# Patient Record
Sex: Male | Born: 1995 | Race: Black or African American | Hispanic: No | Marital: Single | Smoking: Never smoker
Health system: Southern US, Community
[De-identification: ages and names within clinical notes are randomized; demographics above are authoritative.]

---
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
## 2013-09-07 ENCOUNTER — Encounter (HOSPITAL_COMMUNITY): Payer: Self-pay | Admitting: Emergency Medicine

## 2013-09-07 ENCOUNTER — Emergency Department (HOSPITAL_COMMUNITY)
Admission: EM | Admit: 2013-09-07 | Discharge: 2013-09-07 | Disposition: A | Discharge status: Home / Self Care | Payer: BC Managed Care – PPO | Source: Home / Self Care

## 2013-09-07 DIAGNOSIS — J019 Acute sinusitis, unspecified: Secondary | ICD-10-CM

## 2013-09-07 MED ORDER — FEXOFENADINE HCL 180 MG PO TABS: 180.0000 mg | ORAL_TABLET | Freq: Every day | ORAL | Status: DC

## 2013-09-07 NOTE — ED Provider Notes (Signed)
CSN: [id number]     Arrival date & time 09/07/13  1038 History   First MD Initiated Contact with Patient 09/07/13 1247     Chief Complaint  Patient presents with  . Cough   (Consider location/radiation/quality/duration/timing/severity/associated sxs/prior Treatment) HPI Comments: 17 year old male is accompanied by his mother with a complaint of cough for 3 weeks. He has been taking Mucinex during this time but without relief. He denies PND or fever.  Patient is a 17 y.o. male presenting with cough.  Cough Associated symptoms: sore throat   Associated symptoms: no diaphoresis, no ear pain, no eye discharge, no fever, no rash, no rhinorrhea, no shortness of breath and no wheezing     History reviewed. No pertinent past medical history. History reviewed. No pertinent past surgical history. No family history on file. History  Substance Use Topics  . Smoking status: Never Smoker   . Smokeless tobacco: Not on file  . Alcohol Use: No    Review of Systems  Constitutional: Negative for fever, diaphoresis, activity change and fatigue.  HENT: Positive for sore throat. Negative for ear pain, facial swelling, postnasal drip, rhinorrhea and trouble swallowing.   Eyes: Negative for pain, discharge and redness.  Respiratory: Positive for cough. Negative for chest tightness, shortness of breath and wheezing.   Cardiovascular: Negative.   Gastrointestinal: Negative.   Musculoskeletal: Negative.  Negative for neck pain and neck stiffness.  Skin: Negative for rash.  Neurological: Negative.     Allergies  Review of patient's allergies indicates no known allergies.  Home Medications   Current Outpatient Rx  Name  Route  Sig  Dispense  Refill  . fexofenadine (ALLEGRA) 180 MG tablet   Oral   Take 1 tablet (180 mg total) by mouth daily.   14 tablet   0    BP 106/94  Pulse 72  Temp(Src) 97.1 F (36.2 C) (Oral)  Resp 16  Wt 185 lb (83.915 kg)  SpO2 99% Physical Exam  Nursing note  and vitals reviewed. Constitutional: He is oriented to person, place, and time. He appears well-developed and well-nourished. No distress.  HENT:  Mouth/Throat: No oropharyngeal exudate.  Bilateral TMs are normal Oropharynx with minor erythema and copious amount of clear PND.  Eyes: Conjunctivae and EOM are normal.  Neck: Normal range of motion. Neck supple.  Cardiovascular: Normal rate, regular rhythm and normal heart sounds.   Pulmonary/Chest: Effort normal and breath sounds normal. No respiratory distress. He has no wheezes. He has no rales.  Musculoskeletal: Normal range of motion. He exhibits no edema.  Lymphadenopathy:    He has no cervical adenopathy.  Neurological: He is alert and oriented to person, place, and time.  Skin: Skin is warm and dry. No rash noted.  Psychiatric: He has a normal mood and affect.    ED Course  Procedures (including critical care time) Labs Review Labs Reviewed - No data to display Imaging Review No results found.     MDM   1. Acute rhinosinusitis    Allegra 180 mg daily when necessary drainage Robitussin-DM as directed Drink plenty of fluids stay well hydrated     Hayden Rasmussen, NP 09/07/13 1305

## 2013-09-07 NOTE — ED Notes (Signed)
17 yr old is here with his mom with complaints of cough-yellowish for three weeks. He has tried Mucinex and NyQuil with no success. Denies: SOB; Chest Pain

## 2013-09-11 NOTE — ED Provider Notes (Signed)
Medical screening examination/treatment/procedure(s) were performed by a resident physician or non-physician practitioner and as the supervising physician I was immediately available for consultation/collaboration.  Evan Corey, MD    Evan S Corey, MD 09/11/13 0844 

## 2013-12-13 DIAGNOSIS — M779 Enthesopathy, unspecified: Secondary | ICD-10-CM

## 2014-03-18 ENCOUNTER — Encounter (HOSPITAL_COMMUNITY): Payer: Self-pay | Admitting: Emergency Medicine

## 2014-03-18 ENCOUNTER — Emergency Department (INDEPENDENT_AMBULATORY_CARE_PROVIDER_SITE_OTHER)
Admission: EM | Admit: 2014-03-18 | Discharge: 2014-03-18 | Disposition: A | Discharge status: Home / Self Care | Payer: BC Managed Care – PPO | Source: Home / Self Care | Attending: Emergency Medicine | Admitting: Emergency Medicine

## 2014-03-18 DIAGNOSIS — L309 Dermatitis, unspecified: Secondary | ICD-10-CM

## 2014-03-18 DIAGNOSIS — L259 Unspecified contact dermatitis, unspecified cause: Secondary | ICD-10-CM

## 2014-03-18 MED ORDER — TRIAMCINOLONE ACETONIDE 0.1 % EX CREA: 1.0000 "application " | TOPICAL_CREAM | Freq: Three times a day (TID) | CUTANEOUS | Status: DC

## 2014-03-18 MED ORDER — PREDNISONE 20 MG PO TABS: 20.0000 mg | ORAL_TABLET | Freq: Two times a day (BID) | ORAL | Status: DC

## 2014-03-18 NOTE — Discharge Instructions (Signed)
Eczema has been described as "the itch that rashes."  The primary problem is inflammation of the skin, this is followed by the irresistible urge to scratch.  The scratching is what causes the rash.   ° °Eczema is thought to be hereditary.  It is often accompanied by other inflammatory diseases such as asthma or hay fever.  There are certain environmental things that may make it worse such as dryness of the skin, wool clothing, infection, certain allergens, and stress.  It is important to recognize that it is not caused by stress, and the search for an allergic cause is not helpful. ° °While there is no cure for eczema, it can be controlled with prescription medication and lifestyle changes. Suggestions follow for successful control of eczema: ° °· Avoid harsh soaps.  Use Dove, Cetaphil, Neutrogena, Aveeno.  Do not use Ivory. °· Take infrequent baths or showers, use luke warm water.  Get in and out of the bath or shower as quickly as possible.  Do not scrub the skin. Pat the skin dry after bathing.   °· Immediately after bathing apply a skin moisturizer such as Aquaphor, Vaseline, Eucerin, Cetaphil, Nutraderm, or Nivea. These should be applied 2 times a day, immediately after bathing and after all hand washing.  °· Use unscented laundry detergent such as Cheer-free, All-free, or unscented Bounce since these have no perfumes or preservatives.  °· Keep thermostat as low as possible in winter.  Consider a humidifier in bedroom.   °· Avoid wool clothing, blended or synthetic fabric or shirt collar tags--use  100% cotton clothing instead.  °· Avoid scratching.  Try using an ice cube on  Itchy area.  °· May use antihistamines such as Benadryl, Zyrtec, Allegra, or Claritin for itching.  °· Treat skin infections immediately. °· Eczema is a chronic and recurring condition.  You should be followed by a primary care doctor or a dermatology specialist or both.  ° ° ° °

## 2014-03-18 NOTE — ED Notes (Signed)
Patient complains of rash to the center of his chest For the past couple of days. On and off again becomes itchy

## 2014-03-18 NOTE — ED Provider Notes (Signed)
  Chief Complaint    Chief Complaint  Patient presents with  . Rash    History of Present Illness      Manuel Bolton is a 18 year old male who has had a 3 to four-day history of a rash on his right upper chest and right shoulder. He cannot think of anything that may have come in contact with his skin such as allergens or antigens. No exposure to poison ivy, changes in soaps, detergents, washing powders, fabric softeners, or dryer sheets. He's had no exposure to plants, animals, chemicals, cosmetics, or hair care products. He denies any history of allergies or asthma. He's had no fever, chills, swelling of lips, tongue, or throat, coughing, wheezing, or shortness of breath.  Review of Systems   Other than as noted above, the patient denies any of the following symptoms: Systemic:  No fever or chills. ENT:  No nasal congestion, rhinorrhea, sore throat, swelling of lips, tongue or throat. Resp:  No cough, wheezing, or shortness of breath.  PMFSH    Past medical history, family history, social history, meds, and allergies were reviewed.   Physical Exam     Vital signs:  BP 115/65  Pulse 49  Temp(Src) 99.1 F (37.3 C) (Oral)  Resp 18  SpO2 100% Gen:  Alert, oriented, in no distress. ENT:  Pharynx clear, no intraoral lesions, moist mucous membranes. Lungs:  Clear to auscultation. Skin:  He has a hyperpigmented, eczematous area on the right upper chest, right shoulder, and a small streaky area on his upper back just above his shoulder blade. Skin was otherwise clear.  Assessment    The encounter diagnosis was Eczema.  Differential diagnosis is eczema versus contact dermatitis.  Plan     1.  Meds:  The following meds were prescribed:   Discharge Medication List as of 03/18/2014  7:02 PM    START taking these medications   Details  predniSONE (DELTASONE) 20 MG tablet Take 1 tablet (20 mg total) by mouth 2 (two) times daily., Starting 03/18/2014, Until Discontinued, Normal     triamcinolone cream (KENALOG) 0.1 % Apply 1 application topically 3 (three) times daily., Starting 03/18/2014, Until Discontinued, Normal        2.  Patient Education/Counseling:  The patient was given appropriate handouts, self care instructions, and instructed in symptomatic relief.  If no better in one week, followup with dermatology.  3.  Follow up:  The patient was told to follow up here if no better in 3 to 4 days, or sooner if becoming worse in any way, and given some red flag symptoms such as worsening rash, fever, or difficulty breathing which would prompt immediate return.  Follow up here if necessary.      Reuben Likesavid C Harmoney Sienkiewicz, MD 03/18/14 [phone]

## 2014-05-20 ENCOUNTER — Emergency Department (INDEPENDENT_AMBULATORY_CARE_PROVIDER_SITE_OTHER)
Admission: EM | Admit: 2014-05-20 | Discharge: 2014-05-20 | Disposition: A | Discharge status: Home / Self Care | Payer: BC Managed Care – PPO | Source: Home / Self Care | Attending: Emergency Medicine | Admitting: Emergency Medicine

## 2014-05-20 ENCOUNTER — Encounter (HOSPITAL_COMMUNITY): Payer: Self-pay | Admitting: Emergency Medicine

## 2014-05-20 DIAGNOSIS — X500XXA Overexertion from strenuous movement or load, initial encounter: Secondary | ICD-10-CM

## 2014-05-20 DIAGNOSIS — X503XXA Overexertion from repetitive movements, initial encounter: Secondary | ICD-10-CM

## 2014-05-20 DIAGNOSIS — S335XXA Sprain of ligaments of lumbar spine, initial encounter: Secondary | ICD-10-CM

## 2014-05-20 DIAGNOSIS — S39012A Strain of muscle, fascia and tendon of lower back, initial encounter: Secondary | ICD-10-CM

## 2014-05-20 DIAGNOSIS — Y939 Activity, unspecified: Secondary | ICD-10-CM

## 2014-05-20 LAB — POCT URINALYSIS DIP (DEVICE)
BILIRUBIN URINE: NEGATIVE
Glucose, UA: NEGATIVE mg/dL
Hgb urine dipstick: NEGATIVE
Ketones, ur: NEGATIVE mg/dL
LEUKOCYTES UA: NEGATIVE
Nitrite: NEGATIVE
PH: 7.5 (ref 5.0–8.0)
PROTEIN: 30 mg/dL — AB
Specific Gravity, Urine: 1.02 (ref 1.005–1.030)
Urobilinogen, UA: 1 mg/dL (ref 0.0–1.0)

## 2014-05-20 MED ORDER — CYCLOBENZAPRINE HCL 5 MG PO TABS: 5.0000 mg | ORAL_TABLET | Freq: Three times a day (TID) | ORAL | Status: DC | PRN

## 2014-05-20 MED ORDER — NAPROXEN 500 MG PO TABS: 500.0000 mg | ORAL_TABLET | Freq: Two times a day (BID) | ORAL | Status: DC

## 2014-05-20 NOTE — ED Provider Notes (Signed)
Chief Complaint   Flank Pain   History of Present Illness   Manuel Bolton is a 18 year old male who works for [employer]. He does lots of heavy lifting. Over the past 5 days he's had pain in the left, upper lumbar spine radiating into the lower chest area. He denies any radiation down into the lower back or buttock. There is no radiation to the leg. He denies any fever or chills. The pain is not worse with breathing. He denies any coughing, wheezing, or shortness of breath. He denies any abdominal pain, nausea, vomiting, or urinary symptoms. There is no numbness or tingling in the legs, no weakness, no bladder or bowel dysfunction. The pain is worse with bending, twisting, lifting.  Review of Systems   Other than as noted above, the patient denies any of the following symptoms: Systemic:  No fever, chills, or unexplained weight loss. GI:  No abdominal pain or incontinence of bowel. GU:  No dysuria, frequency, urgency, or hematuria. No incontinence of urine or urinary retention.  M-S:  No neck pain or arthritis. Neuro:  No paresthesias, headache, saddle anesthesia, muscular weakness, or progressive neurological deficit.  PMFSH   Past medical history, family history, social history, meds, and allergies were reviewed. Specifically, there is no history of cancer, major trauma, osteoporosis, immunosuppression, or HIV infection.   Physical Examination    Vital signs:  BP 110/64  Pulse 54  Temp(Src) 98.9 F (37.2 C) (Oral)  Resp 16  SpO2 100% General:  Alert, oriented, in no distress. Lungs: Clear to auscultation. Heart: Regular rhythm, no gallop or murmur. Abdomen:  Soft, non-tender.  No organomegaly or mass.  No pulsatile midline abdominal mass or bruit. Back:  There is mild pain to palpation around the right CVA area. There is no CVA tenderness to percussion. The back has a full range of motion with minimal pain. Straight leg raising is negative. Neuro:  Normal muscle strength, sensations  and DTRs. Extremities: Pedal pulses were full, there was no edema. Skin:  Clear, warm and dry.  No rash.  Labs   Results for orders placed during the hospital encounter of 05/20/14  POCT URINALYSIS DIP (DEVICE)      Result Value Ref Range   Glucose, UA NEGATIVE  NEGATIVE mg/dL   Bilirubin Urine NEGATIVE  NEGATIVE   Ketones, ur NEGATIVE  NEGATIVE mg/dL   Specific Gravity, Urine 1.020  1.005 - 1.030   Hgb urine dipstick NEGATIVE  NEGATIVE   pH 7.5  5.0 - 8.0   Protein, ur 30 (*) NEGATIVE mg/dL   Urobilinogen, UA 1.0  0.0 - 1.0 mg/dL   Nitrite NEGATIVE  NEGATIVE   Leukocytes, UA NEGATIVE  NEGATIVE    Assessment   The encounter diagnosis was Lumbar strain, initial encounter.  No evidence of cauda equina syndrome, discitis, epidural abscess, or aneurism.    Plan     1.  Meds:  The following meds were prescribed:   Discharge Medication List as of 05/20/2014  5:50 PM    START taking these medications   Details  cyclobenzaprine (FLEXERIL) 5 MG tablet Take 1 tablet (5 mg total) by mouth 3 (three) times daily as needed for muscle spasms., Starting 05/20/2014, Until Discontinued, Normal    naproxen (NAPROSYN) 500 MG tablet Take 1 tablet (500 mg total) by mouth 2 (two) times daily., Starting 05/20/2014, Until Discontinued, Normal        2.  Patient Education/Counseling:  The patient was given appropriate handouts, self care instructions, and  instructed in symptomatic relief. The patient was encouraged to try to be as active as possible and given some exercises to do followed by moist heat.  3.  Follow up:  The patient was told to follow up here if no better in 3 to 4 days, or sooner if becoming worse in any way, and given some red flag symptoms such as worsening pain or new neurological symptoms which would prompt immediate return.      Reuben Likes, MD 05/20/14 [phone]

## 2014-05-20 NOTE — Discharge Instructions (Signed)
Do exercises twice daily followed by moist heat for 15 minutes. ° ° ° ° ° °Try to be as active as possible. ° °If no better in 2 weeks, follow up with orthopedist. ° ° °

## 2014-05-20 NOTE — ED Notes (Signed)
Right side pain for 5 days.  No known injury.  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  Has tried rest and advil .

## 2015-12-31 ENCOUNTER — Emergency Department (HOSPITAL_COMMUNITY)
Admission: EM | Admit: 2015-12-31 | Discharge: 2015-12-31 | Disposition: A | Discharge status: Home / Self Care | Payer: Worker's Compensation | Attending: Emergency Medicine | Admitting: Emergency Medicine

## 2015-12-31 ENCOUNTER — Encounter (HOSPITAL_COMMUNITY): Payer: Self-pay | Admitting: Emergency Medicine

## 2015-12-31 DIAGNOSIS — Z79899 Other long term (current) drug therapy: Secondary | ICD-10-CM | POA: Diagnosis not present

## 2015-12-31 DIAGNOSIS — S0181XA Laceration without foreign body of other part of head, initial encounter: Secondary | ICD-10-CM | POA: Diagnosis present

## 2015-12-31 DIAGNOSIS — Y9289 Other specified places as the place of occurrence of the external cause: Secondary | ICD-10-CM | POA: Insufficient documentation

## 2015-12-31 DIAGNOSIS — Y9389 Activity, other specified: Secondary | ICD-10-CM | POA: Insufficient documentation

## 2015-12-31 DIAGNOSIS — W228XXA Striking against or struck by other objects, initial encounter: Secondary | ICD-10-CM | POA: Insufficient documentation

## 2015-12-31 DIAGNOSIS — Y990 Civilian activity done for income or pay: Secondary | ICD-10-CM | POA: Diagnosis not present

## 2015-12-31 DIAGNOSIS — Z791 Long term (current) use of non-steroidal anti-inflammatories (NSAID): Secondary | ICD-10-CM | POA: Insufficient documentation

## 2015-12-31 DIAGNOSIS — Z7952 Long term (current) use of systemic steroids: Secondary | ICD-10-CM | POA: Diagnosis not present

## 2015-12-31 DIAGNOSIS — S0990XA Unspecified injury of head, initial encounter: Secondary | ICD-10-CM

## 2015-12-31 MED ORDER — LIDOCAINE-EPINEPHRINE 2 %-1:100000 IJ SOLN: 20.0000 mL | Freq: Once | INTRAMUSCULAR | Status: DC

## 2015-12-31 NOTE — Discharge Instructions (Signed)
Please read and follow all provided instructions.  Your diagnoses today include:  1. Facial laceration, initial encounter   2. Minor head injury, initial encounter     Tests performed today include:  Vital signs. See below for your results today.   Medications prescribed:   None  Take any prescribed medications only as directed.   Home care instructions:  Follow any educational materials and wound care instructions contained in this packet.   Keep affected area above the level of your heart when possible to minimize swelling. Wash area gently twice a day with warm soapy water. Do not apply alcohol or hydrogen peroxide. Cover the area if it draining or weeping.   Follow-up instructions: Suture Removal: Return to the Emergency Department or see your primary care care doctor in 4-5 days for a recheck of your wound and removal of your sutures or staples.    Return instructions:  Return to the Emergency Department if you have:  Fever  Worsening pain  Worsening swelling of the wound  Pus draining from the wound  Redness of the skin that moves away from the wound, especially if it streaks away from the affected area   Any other emergent concerns  Your vital signs today were: BP 144/75 mmHg   Pulse 72   Temp(Src) 98.5 F (36.9 C) (Oral)   Resp 18   Ht 5\' 11"  (1.803 m)   Wt 83.915 kg   BMI 25.81 kg/m2   SpO2 94% If your blood pressure (BP) was elevated above 135/85 this visit, please have this repeated by your doctor within one month. --------------

## 2015-12-31 NOTE — ED Notes (Signed)
Pt states he got hurt at work  Pt states he got hit in the head with a metal rail  Pt has a small laceration between his eyes  Bleeding controlled  Denies LOC

## 2015-12-31 NOTE — ED Provider Notes (Signed)
CSN: [id number]     Arrival date & time 12/31/15  2112 History  By signing my name below, I, [name], attest that this documentation has been prepared under the direction and in the presence of Renne Crigler, PA-C. Electronically Signed: [name], ED Scribe. 12/31/2015. 10:27 PM.    Chief Complaint  Patient presents with  . Facial Laceration    The history is provided by the patient. No language interpreter was used.   HPI Comments: Manuel Bolton is a 20 y.o. male who presents to the Emergency Department complaining of sudden onset, gradual worsening, moderate, laceration to the central forehead onset about 2 hours ago. Pt reports he was at work when he was hit in the head with a metal rail in between his eyes. He notes running cold water over the laceration to clean it out. He reports he did not lose consciousness. Pt is up to date on his tetanus shot. Denies vomiting or visual changes. Pt has no known drug allergies.   History reviewed. No pertinent past medical history. History reviewed. No pertinent past surgical history. Family History  Problem Relation Age of Onset  . Diabetes Other   . Stroke Other   . Cancer Other    Social History  Substance Use Topics  . Smoking status: Never Smoker   . Smokeless tobacco: None  . Alcohol Use: No    Review of Systems  Constitutional: Negative for fatigue.  HENT: Negative for tinnitus.   Eyes: Negative for photophobia, pain and visual disturbance.  Respiratory: Negative for shortness of breath.   Cardiovascular: Negative for chest pain.  Gastrointestinal: Negative for nausea and vomiting.  Musculoskeletal: Negative for back pain, gait problem and neck pain.  Skin: Positive for wound.  Neurological: Negative for dizziness, weakness, light-headedness, numbness and headaches.  Psychiatric/Behavioral: Negative for confusion and decreased concentration.      Allergies  Review of patient's allergies indicates no known  allergies.  Home Medications   Prior to Admission medications   Medication Sig Start Date End Date Taking? Authorizing Provider  cyclobenzaprine (FLEXERIL) 5 MG tablet Take 1 tablet (5 mg total) by mouth 3 (three) times daily as needed for muscle spasms. 05/20/14   Reuben Likes, MD  fexofenadine (ALLEGRA) 180 MG tablet Take 1 tablet (180 mg total) by mouth daily. 09/07/13   Hayden Rasmussen, NP  naproxen (NAPROSYN) 500 MG tablet Take 1 tablet (500 mg total) by mouth 2 (two) times daily. 05/20/14   Reuben Likes, MD  predniSONE (DELTASONE) 20 MG tablet Take 1 tablet (20 mg total) by mouth 2 (two) times daily. 03/18/14   Reuben Likes, MD  triamcinolone cream (KENALOG) 0.1 % Apply 1 application topically 3 (three) times daily. 03/18/14   Reuben Likes, MD   Triage vitals: BP 144/75 mmHg  Pulse 72  Temp(Src) 98.5 F (36.9 C) (Oral)  Resp 18  Ht  (1.803 m)  Wt 185 lb (83.915 kg)  BMI 25.81 kg/m2  SpO2 94%  Physical Exam  Constitutional: He is oriented to person, place, and time. He appears well-developed and well-nourished.  HENT:  Head: Normocephalic. Head is without raccoon's eyes and without Battle's sign.  Right Ear: Tympanic membrane, external ear and ear canal normal. No hemotympanum.  Left Ear: Tympanic membrane, external ear and ear canal normal. No hemotympanum.  Nose: Nose normal. No nasal septal hematoma.  Mouth/Throat: Oropharynx is clear and moist.  2cm linear vertical laceration, mildly gaping, clean, hemostatic, between eyes on inferior  forehead. Base of wound fully explored and no foreign bodies, tendon, nerve, or significant vascular injury noted. Patient able to raise forehead without difficulty.   Eyes: Conjunctivae, EOM and lids are normal. Pupils are equal, round, and reactive to light.  No visible hyphema  Neck: Normal range of motion. Neck supple.  Cardiovascular: Normal rate and regular rhythm.   Pulmonary/Chest: Effort normal and breath sounds normal.   Abdominal: Soft. There is no tenderness.  Musculoskeletal: Normal range of motion.       Cervical back: He exhibits normal range of motion, no tenderness and no bony tenderness.       Thoracic back: He exhibits no tenderness and no bony tenderness.       Lumbar back: He exhibits no tenderness and no bony tenderness.  Neurological: He is alert and oriented to person, place, and time. He has normal strength and normal reflexes. No cranial nerve deficit or sensory deficit. Coordination normal. GCS eye subscore is 4. GCS verbal subscore is 5. GCS motor subscore is 6.  Skin: Skin is warm and dry.  Psychiatric: He has a normal mood and affect.  Nursing note and vitals reviewed.   ED Course  Procedures  DIAGNOSTIC STUDIES: Oxygen Saturation is 94% on RA, low by my interpretation.  COORDINATION OF CARE:  10:25 PM Discussed treatment plan which includes numbing the area and suturing the laceration with pt at bedside and pt agreed to plan.  LACERATION REPAIR Performed by: Carolee RotaGEIPLE,Lakiesha Ralphs S Authorized by: Carolee RotaGEIPLE,Jameca Chumley S Consent: Verbal consent obtained. Risks and benefits: risks, benefits and alternatives were discussed Consent given by: patient Patient identity confirmed: provided demographic data Prepped and Draped in normal sterile fashion Wound explored  Laceration Location: forehead  Laceration Length: 2cm  No Foreign Bodies seen or palpated  Anesthesia: local infiltration  Local anesthetic: lidocaine 2% without epinephrine  Anesthetic total: 3 ml  Irrigation method: skin scrub with dermal cleanser Amount of cleaning: standard  Skin closure: 5-0 Ethilon  Number of sutures: 7  Technique: simple interrupted  Patient tolerance: Patient tolerated the procedure well with no immediate complications.   11:19 PM Patient counseled on wound care. Patient counseled on need to return or see PCP/urgent care for suture removal in 4-5 days. Patient was urged to return to the  Emergency Department urgently with worsening pain, swelling, expanding erythema especially if it streaks away from the affected area, fever, or if they have any other concerns. Patient verbalized understanding.   Patient was counseled on head injury precautions and symptoms that should indicate their return to the ED.  These include severe worsening headache, vision changes, confusion, loss of consciousness, trouble walking, nausea & vomiting, or weakness/tingling in extremities.      MDM   Final diagnoses:  Facial laceration, initial encounter  Minor head injury, initial encounter   Patient with facial laceration and minor head injury. Facial laceration repaired as above. No significant muscular, vascular, tendon involvement noted.  Minor head injury: No indication for head CT at this time per [place] head CT rules. Patient does not have any other sequelae from his injury at this time.  I personally performed the services described in this documentation, which was scribed in my presence. The recorded information has been reviewed and is accurate.    Renne CriglerJoshua Maryan Sivak, PA-C 12/31/15 2321  Lyndal Pulleyaniel Knott, MD 01/01/16 [phone]

## 2016-04-08 ENCOUNTER — Ambulatory Visit (HOSPITAL_COMMUNITY)
Admission: EM | Admit: 2016-04-08 | Discharge: 2016-04-08 | Disposition: A | Discharge status: Home / Self Care | Payer: BLUE CROSS/BLUE SHIELD | Attending: Family Medicine | Admitting: Family Medicine

## 2016-04-08 ENCOUNTER — Encounter (HOSPITAL_COMMUNITY): Payer: Self-pay | Admitting: Emergency Medicine

## 2016-04-08 DIAGNOSIS — S8011XA Contusion of right lower leg, initial encounter: Secondary | ICD-10-CM | POA: Diagnosis not present

## 2016-04-08 MED ORDER — NAPROXEN 500 MG PO TABS: 500.0000 mg | ORAL_TABLET | Freq: Two times a day (BID) | ORAL | 0 refills | Status: DC

## 2016-04-08 NOTE — ED Triage Notes (Signed)
Box fell on right knee last Tuesday night.  Has tried epsom salt, ibuprofen, pain better, but continues

## 2016-04-08 NOTE — ED Triage Notes (Signed)
Right knee pain for one week.  Patient needs pain medicine and work note.  "it dont hurt that bad"

## 2016-04-08 NOTE — ED Provider Notes (Signed)
CSN: [id number]     Arrival date & time 04/08/16  1004 History   None    Chief Complaint  Patient presents with  . Knee Pain   (Consider location/radiation/quality/duration/timing/severity/associated sxs/prior Treatment) The history is provided by the patient.  Knee Pain  Location:  Leg Leg location:  R leg Pain details:    Quality:  Aching   Radiates to:  Does not radiate   Severity:  Mild   Duration:  2 days   Timing:  Constant   Progression:  Partially resolved Chronicity:  New Dislocation: no   Foreign body present:  No foreign bodies Tetanus status:  Unknown Prior injury to area:  No Relieved by:  Ice and rest Worsened by:  Nothing Ineffective treatments:  None tried   History reviewed. No pertinent past medical history. History reviewed. No pertinent surgical history. Family History  Problem Relation Age of Onset  . Diabetes Other   . Stroke Other   . Cancer Other    Social History  Substance Use Topics  . Smoking status: Never Smoker  . Smokeless tobacco: Not on file  . Alcohol use No    Review of Systems  Constitutional: Negative.   HENT: Negative.   Eyes: Negative.   Respiratory: Negative.   Cardiovascular: Negative.   Gastrointestinal: Negative.   Endocrine: Negative.   Genitourinary: Negative.   Musculoskeletal: Positive for myalgias.  Skin: Negative.   Allergic/Immunologic: Negative.   Neurological: Negative.   Hematological: Negative.   Psychiatric/Behavioral: Negative.     Allergies  Review of patient's allergies indicates no known allergies.  Home Medications   Prior to Admission medications   Medication Sig Start Date End Date Taking? Authorizing Provider  cyclobenzaprine (FLEXERIL) 5 MG tablet Take 1 tablet (5 mg total) by mouth 3 (three) times daily as needed for muscle spasms. 05/20/14   Reuben Likes, MD  fexofenadine (ALLEGRA) 180 MG tablet Take 1 tablet (180 mg total) by mouth daily. 09/07/13   Hayden Rasmussen, NP  naproxen  (NAPROSYN) 500 MG tablet Take 1 tablet (500 mg total) by mouth 2 (two) times daily. 05/20/14   Reuben Likes, MD  naproxen (NAPROSYN) 500 MG tablet Take 1 tablet (500 mg total) by mouth 2 (two) times daily with a meal. 04/08/16   Deatra Canter, FNP  predniSONE (DELTASONE) 20 MG tablet Take 1 tablet (20 mg total) by mouth 2 (two) times daily. 03/18/14   Reuben Likes, MD  triamcinolone cream (KENALOG) 0.1 % Apply 1 application topically 3 (three) times daily. 03/18/14   Reuben Likes, MD   Meds Ordered and Administered this Visit  Medications - No data to display  BP 122/65 (BP Location: Left Arm) Comment (BP Location): large cuff  Pulse 65   Temp 98.3 F (36.8 C) (Oral)   Resp 16   SpO2 100%  No data found.   Physical Exam  Constitutional: He is oriented to person, place, and time. He appears well-developed and well-nourished.  HENT:  Head: Normocephalic and atraumatic.  Eyes: EOM are normal. Pupils are equal, round, and reactive to light.  Neck: Normal range of motion. Neck supple.  Cardiovascular: Normal rate, regular rhythm and normal heart sounds.   Pulmonary/Chest: Effort normal and breath sounds normal.  Abdominal: Soft. Bowel sounds are normal.  Musculoskeletal: He exhibits tenderness.  TTP right medial calf and no deformity or swelling, no homan's  Neurological: He is alert and oriented to person, place, and time.  Nursing note and vitals  reviewed.   Urgent Care Course   Clinical Course    Procedures (including critical care time)  Labs Review Labs Reviewed - No data to display  Imaging Review No results found.   Visual Acuity Review  Right Eye Distance:   Left Eye Distance:   Bilateral Distance:    Right Eye Near:   Left Eye Near:    Bilateral Near:         MDM   1. Contusion of right leg, initial encounter    Naprosyn 500mg  one po bid x 10 days #20 Work note    Deatra Canter, [place] 04/08/16 1123

## 2018-03-19 ENCOUNTER — Encounter (HOSPITAL_COMMUNITY): Payer: Self-pay

## 2018-03-19 ENCOUNTER — Ambulatory Visit (HOSPITAL_COMMUNITY)
Admission: EM | Admit: 2018-03-19 | Discharge: 2018-03-19 | Disposition: A | Discharge status: Home / Self Care | Payer: BLUE CROSS/BLUE SHIELD | Attending: Internal Medicine | Admitting: Internal Medicine

## 2018-03-19 DIAGNOSIS — M791 Myalgia, unspecified site: Secondary | ICD-10-CM | POA: Diagnosis not present

## 2018-03-19 MED ORDER — CYCLOBENZAPRINE HCL 10 MG PO TABS: 10.0000 mg | ORAL_TABLET | Freq: Every evening | ORAL | 0 refills | Status: AC | PRN

## 2018-03-19 NOTE — Discharge Instructions (Signed)
Back pain is probably due to a mild injury of the latissimus muscle, which will take a couple weeks to heal.  Would anticipate gradual improvement in pain/stiffness over the next 2-4 weeks.  Avoid activities (like bench pressing) that markedly increase the pain.  Continue ibuprofen 3 tabs at bedtime for pain; could take as often as 2-3 times daily if needed.  Prescription sent to pharmacy for a mild muscle relaxer, cyclobenzaprine, to take at bedtime for stiffness/cramping, as needed.

## 2018-03-19 NOTE — ED Provider Notes (Signed)
[facility]    CSN: [id number] Arrival date & time: 03/19/18  0920     History   Chief Complaint Chief Complaint  Patient presents with  . Back Pain    HPI Manuel Bolton is a 22 y.o. male.   He presents today with a few days history of pain in the lateral mid left back.  This started after he was doing some weight lifting on 7/5.  He bench pressed 110 pounds a couple of weeks ago, and then the following Friday pressed 120 pounds, and then on 7/5 pressed 130 pounds.  He felt fine for couple of days, and then on 7/7 started having intermittent discomfort in the left lateral mid back.   He rates this pain about a 3 or 4 out of 10.  He particularly notices this if he has to bend forward and reach for something.  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  He is taking 3 tabs of OTC ibuprofen at bedtime, with some relief.  Heat and ice have been minimally helpful.  No weakness or clumsiness in the arms or legs, was able to walk into the urgent care independently.  No change in bowel or bladder.  Able to move his neck and both shoulders without difficulty.  No rash.    HPI  History reviewed. No pertinent past medical history.  History reviewed. No pertinent surgical history.     Home Medications    Prior to Admission medications   Medication Sig Start Date End Date Taking? Authorizing Provider  cyclobenzaprine (FLEXERIL) 10 MG tablet Take 1 tablet (10 mg total) by mouth at bedtime as needed for up to 15 days for muscle spasms. 03/19/18 04/03/18  Isa RankinMurray, Laura Wilson, MD    Family History Family History  Problem Relation Age of Onset  . Diabetes Other   . Stroke Other   . Cancer Other   . Healthy Mother   . Healthy Father     Social History Social History   Tobacco Use  . Smoking status: Never Smoker  Substance Use Topics  . Alcohol use: No  . Drug use: No     Allergies   Patient has no known allergies.   Review of Systems Review of Systems    All other systems reviewed and are negative.    Physical Exam Triage Vital Signs ED Triage Vitals [03/19/18 0946]  Enc Vitals Group     BP 116/69     Pulse Rate (!) 55     Resp 19     Temp      Temp src      SpO2 100 %     Weight      Height      Pain Score 4     Pain Loc    Updated Vital Signs BP 116/69   Pulse (!) 55   Resp 19   SpO2 100%  Physical Exam  Constitutional: He is oriented to person, place, and time. No distress.  Alert, nicely groomed  HENT:  Head: Atraumatic.  Eyes:  Conjugate gaze, no eye redness/drainage  Neck: Neck supple.  Cardiovascular: Normal rate.  Pulmonary/Chest: No respiratory distress.  Lungs clear, symmetric breath sounds  Abdominal: He exhibits no distension.  Musculoskeletal: Normal range of motion.       Arms: Full and symmetric range of motion of both shoulders, no pain Full and symmetric range of motion at the neck, no pain No tenderness to palpation  along the left latissimus dorsi, which is the area he indicates is painful  Neurological: He is alert and oriented to person, place, and time.  Skin: Skin is warm and dry.  No cyanosis  Nursing note and vitals reviewed.    UC Treatments / Results   Final Clinical Impressions(s) / UC Diagnoses   Final diagnoses:  Myalgia     Discharge Instructions     Back pain is probably due to a mild injury of the latissimus muscle, which will take a couple weeks to heal.  Would anticipate gradual improvement in pain/stiffness over the next 2-4 weeks.  Avoid activities (like bench pressing) that markedly increase the pain.  Continue ibuprofen 3 tabs at bedtime for pain; could take as often as 2-3 times daily if needed.  Prescription sent to pharmacy for a mild muscle relaxer, cyclobenzaprine, to take at bedtime for stiffness/cramping, as needed.     ED Prescriptions    Medication Sig Dispense Auth. Provider   cyclobenzaprine (FLEXERIL) 10 MG tablet Take 1 tablet (10 mg total) by mouth  at bedtime as needed for up to 15 days for muscle spasms. 10 tablet Isa Rankin, MD       Isa Rankin, MD 03/21/18 [phone]

## 2018-03-19 NOTE — ED Triage Notes (Signed)
Pt presents with complaints of back spasms after possibly spraining back while doing bench press, some relief with ibuprofen.

## 2018-05-12 LAB — BASIC METABOLIC PANEL
BUN: 12 (ref 4–21)
Creatinine: 1.1 (ref 0.6–1.3)
Glucose: 55

## 2018-05-12 LAB — LIPID PANEL
Cholesterol: 199 (ref 0–200)
HDL: 71 — AB (ref 35–70)
LDL Cholesterol: 114
Triglycerides: 66 (ref 40–160)

## 2018-06-14 ENCOUNTER — Ambulatory Visit (HOSPITAL_COMMUNITY)
Admission: EM | Admit: 2018-06-14 | Discharge: 2018-06-14 | Disposition: A | Discharge status: Home / Self Care | Payer: BLUE CROSS/BLUE SHIELD | Attending: Family Medicine | Admitting: Family Medicine

## 2018-06-14 ENCOUNTER — Encounter (HOSPITAL_COMMUNITY): Payer: Self-pay

## 2018-06-14 DIAGNOSIS — H9202 Otalgia, left ear: Secondary | ICD-10-CM

## 2018-06-14 DIAGNOSIS — H6122 Impacted cerumen, left ear: Secondary | ICD-10-CM

## 2018-06-14 MED ORDER — NEOMYCIN-POLYMYXIN-HC 3.5-10000-1 OT SUSP: 4.0000 [drp] | Freq: Four times a day (QID) | OTIC | 0 refills | Status: DC

## 2018-06-14 NOTE — Discharge Instructions (Addendum)
Use drops 4 x a day This will help soften the wax and reduce discomfort Call the ENT office for an appointment DO NOT USE Q TIPS

## 2018-06-14 NOTE — ED Triage Notes (Signed)
Pt presents with pain in left ear after using a qtip, states that he cannot hear as well. This started on Saturday.

## 2018-06-14 NOTE — ED Provider Notes (Signed)
[facility]    CSN: [id number] Arrival date & time: 06/14/18  0908     History   Chief Complaint Chief Complaint  Patient presents with  . Otalgia    HPI Manuel Bolton is a 22 y.o. male.   HPI  Patient placed a Q-tip too far in his ear on Sunday he now has pain and decreased hearing in the left ear.  No cough cold runny nose.  No prior ear problems.  No fever.  No discharge applied blood from the ear.  History reviewed. No pertinent past medical history.  There are no active problems to display for this patient. Alopecia  History reviewed. No pertinent surgical history.     Home Medications    Prior to Admission medications   Medication Sig Start Date End Date Taking? Authorizing Provider  neomycin-polymyxin-hydrocortisone (CORTISPORIN) 3.5-10000-1 OTIC suspension Place 4 drops into the left ear 4 (four) times daily. 06/14/18   Eustace Moore, MD    Family History Family History  Problem Relation Age of Onset  . Diabetes Other   . Stroke Other   . Cancer Other   . Healthy Mother   . Healthy Father     Social History Social History   Tobacco Use  . Smoking status: Never Smoker  . Smokeless tobacco: Never Used  Substance Use Topics  . Alcohol use: No  . Drug use: No     Allergies   Patient has no known allergies.   Review of Systems Review of Systems  Constitutional: Negative for chills and fever.  HENT: Positive for ear pain and hearing loss. Negative for ear discharge and sore throat.   Eyes: Negative for pain and visual disturbance.  Respiratory: Negative for cough and shortness of breath.   Cardiovascular: Negative for chest pain and palpitations.  Gastrointestinal: Negative for abdominal pain and vomiting.  Genitourinary: Negative for dysuria and hematuria.  Musculoskeletal: Negative for arthralgias and back pain.  Skin: Negative for color change and rash.  Neurological: Negative for seizures and syncope.  All other  systems reviewed and are negative.    Physical Exam Triage Vital Signs ED Triage Vitals  Enc Vitals Group     BP 06/14/18 0950 (!) 136/99     Pulse Rate 06/14/18 0950 (!) 55     Resp 06/14/18 0950 19     Temp 06/14/18 0950 98 F (36.7 C)     Temp src --      SpO2 06/14/18 0950 100 %     Weight --      Height --      Head Circumference --      Peak Flow --      Pain Score 06/14/18 0949 6     Pain Loc --      Pain Edu? --      Excl. in GC? --    No data found.  Updated Vital Signs BP (!) 136/99   Pulse (!) 55   Temp 98 F (36.7 C)   Resp 19   SpO2 100%       Physical Exam  Constitutional: He appears well-developed and well-nourished. No distress.  HENT:  Head: Normocephalic and atraumatic.  Right Ear: External ear normal.  Mouth/Throat: Oropharynx is clear and moist.  Right external ear canals partially occluded by wax.  TM visible, clear.  Left canal is occluded with wax.  Eyes: Pupils are equal, round, and reactive to light. Conjunctivae are normal.  Neck: Normal range of  motion.  Cardiovascular: Normal rate.  Pulmonary/Chest: Effort normal. No respiratory distress.  Abdominal: Soft. He exhibits no distension.  Musculoskeletal: Normal range of motion. He exhibits no edema.  Lymphadenopathy:    He has no cervical adenopathy.  Neurological: He is alert.  Skin: Skin is warm and dry.  Psychiatric: He has a normal mood and affect. His behavior is normal.     UC Treatments / Results   Procedures Procedures Ear lavage Unable to remove was with lavage Will refer ENT  Medications Ordered in UC Medications - No data to display  Initial Impression / Assessment and Plan / UC Course  I have reviewed the triage vital signs and the nursing notes.  Pertinent labs & imaging results that were available during my care of the patient were reviewed by me and considered in my medical decision making (see chart for details).    After extensive lavage patient could  not tolerate anymore due to pain.  The external ear canal had a small abrasion and was bleeding.  We will going to give him eardrops to try to help soften the wax, and a referral to ENT.  Final Clinical Impressions(s) / UC Diagnoses   Final diagnoses:  Left ear pain  Hearing loss of left ear due to cerumen impaction     Discharge Instructions     Use drops 4 x a day This will help soften the wax and reduce discomfort Call the ENT office for an appointment DO NOT USE Q TIPS    ED Prescriptions    Medication Sig Dispense Auth. Provider   neomycin-polymyxin-hydrocortisone (CORTISPORIN) 3.5-10000-1 OTIC suspension Place 4 drops into the left ear 4 (four) times daily. 10 mL Eustace Moore, MD     Controlled Substance Prescriptions [place] Controlled Substance Registry consulted? Not Applicable   Eustace Moore, MD 06/14/18 2105

## 2019-06-14 ENCOUNTER — Encounter (HOSPITAL_COMMUNITY): Payer: Self-pay

## 2019-06-14 ENCOUNTER — Ambulatory Visit (HOSPITAL_COMMUNITY)
Admission: EM | Admit: 2019-06-14 | Discharge: 2019-06-14 | Disposition: A | Discharge status: Home / Self Care | Payer: BC Managed Care – PPO | Attending: Family Medicine | Admitting: Family Medicine

## 2019-06-14 ENCOUNTER — Other Ambulatory Visit: Payer: Self-pay

## 2019-06-14 ENCOUNTER — Ambulatory Visit (INDEPENDENT_AMBULATORY_CARE_PROVIDER_SITE_OTHER): Payer: BC Managed Care – PPO

## 2019-06-14 DIAGNOSIS — S6992XA Unspecified injury of left wrist, hand and finger(s), initial encounter: Secondary | ICD-10-CM

## 2019-06-14 MED ORDER — NAPROXEN 500 MG PO TABS: 500.0000 mg | ORAL_TABLET | Freq: Two times a day (BID) | ORAL | 0 refills | Status: DC

## 2019-06-14 NOTE — ED Provider Notes (Signed)
[facility]   [id number] 06/14/19 Arrival Time: 5102  ASSESSMENT & PLAN:  1. Injury of finger of left hand, initial encounter     I have personally viewed the imaging studies ordered this visit. No fractures appreciated.  Reports work-related injury. See work note provided for restrictions until f/u with: Follow-up Information    Schedule an appointment as soon as possible for a visit  with [place].   Why: [address] [place], [place] [address]  [phone]         Meds ordered this encounter  Medications  . naproxen (NAPROSYN) 500 MG tablet    Sig: Take 1 tablet (500 mg total) by mouth 2 (two) times daily with a meal.    Dispense:  20 tablet    Refill:  0    Recommend: Follow-up Information    Schedule an appointment as soon as possible for a visit  with [place].   Why: [address] [place], [place] [address]  [phone]           Reviewed expectations re: course of current medical issues. Questions answered. Outlined signs and symptoms indicating need for more acute intervention. Patient verbalized understanding. After Visit Summary given.  SUBJECTIVE: History from: patient. Ahmaad Neidhardt is a 23 y.o. male who reports fairly persistent moderate pain of his left distal 4th finger; described as aching that does not radiate. Onset: abrupt. First noted: "about two weeks ago". Injury/trama: reports a box fell off a roller at work and "jammed my finger" with immediate discomfort; initial swelling; has decreased. Symptoms have progressed to a point and plateaued since beginning. Aggravating factors: certain movements and grasping. Alleviating factors: rest. Associated symptoms: none reported. Extremity sensation changes or weakness: none. Self treatment: has not tried OTCs for relief of pain. History of similar: no.  History reviewed. No pertinent surgical history.    ROS: As per HPI. All other systems negative.    OBJECTIVE:  Vitals:   06/14/19 1117  BP: 129/87  Pulse: 67  Resp: 18  Temp: 98 F (36.7 C)  TempSrc: Oral  SpO2: 100%    General appearance: alert; no distress HEENT: [place]; AT Neck: supple with FROM Resp: unlabored respirations Extremities: . LUE: warm and well perfused; fairly well localized moderate tenderness over left 4th finger at DIP; without gross deformities; swelling: minimal; bruising: none; ROM: normal with reported discomfort CV: brisk extremity capillary refill of LUE; 2+ radial pulse of LUE. Skin: warm and dry; no visible rashes Neurologic: gait normal; normal reflexes of LUE; normal sensation of LUE; normal strength of LUE Psychological: alert and cooperative; normal mood and affect  Imaging: Dg Finger Ring Left  Result Date: 06/14/2019 CLINICAL DATA:  Box fell on left ring finger.  Pain and swelling. EXAM: LEFT RING FINGER 2+V COMPARISON:  None. FINDINGS: The joint spaces are maintained.  No acute fracture is identified. IMPRESSION: No acute bony findings. Electronically Signed   By: Marijo Sanes M.D.   On: 06/14/2019 12:02     No Known Allergies  PMH: Eczema  Social History   Socioeconomic History  . Marital status: Single    Spouse name: Not on file  . Number of children: Not on file  . Years of education: Not on file  . Highest education level: Not on file  Occupational History  . Not on file  Social Needs  . Financial resource strain: Not on file  . Food insecurity  Worry: Not on file    Inability: Not on file  . Transportation needs    Medical: Not on file    Non-medical: Not on file  Tobacco Use  . Smoking status: Never Smoker  . Smokeless tobacco: Never Used  Substance and Sexual Activity  . Alcohol use: No  . Drug use: No  . Sexual activity: Not on file  Lifestyle  . Physical activity    Days per week: Not on file    Minutes per session: Not on file  . Stress: Not on file   Relationships  . Social Musician on phone: Not on file    Gets together: Not on file    Attends religious service: Not on file    Active member of club or organization: Not on file    Attends meetings of clubs or organizations: Not on file    Relationship status: Not on file  Other Topics Concern  . Not on file  Social History Narrative  . Not on file   Family History  Problem Relation Age of Onset  . Diabetes Other   . Stroke Other   . Cancer Other   . Healthy Mother   . Healthy Father    History reviewed. No pertinent surgical history.    Mardella Layman, MD 06/14/19 1258

## 2019-06-14 NOTE — ED Triage Notes (Signed)
Pt presents with left ring finger injury from work 2 weeks ago.

## 2020-04-09 ENCOUNTER — Other Ambulatory Visit: Payer: Self-pay

## 2020-10-01 IMAGING — DX DG FINGER RING 2+V*L*
3 series · 3 of 3 positions shown · non-contrast
Comparison: None.

CLINICAL DATA: Box fell on left ring finger.  Pain and swelling.

EXAM:
LEFT RING FINGER 2+V

[finger ap]
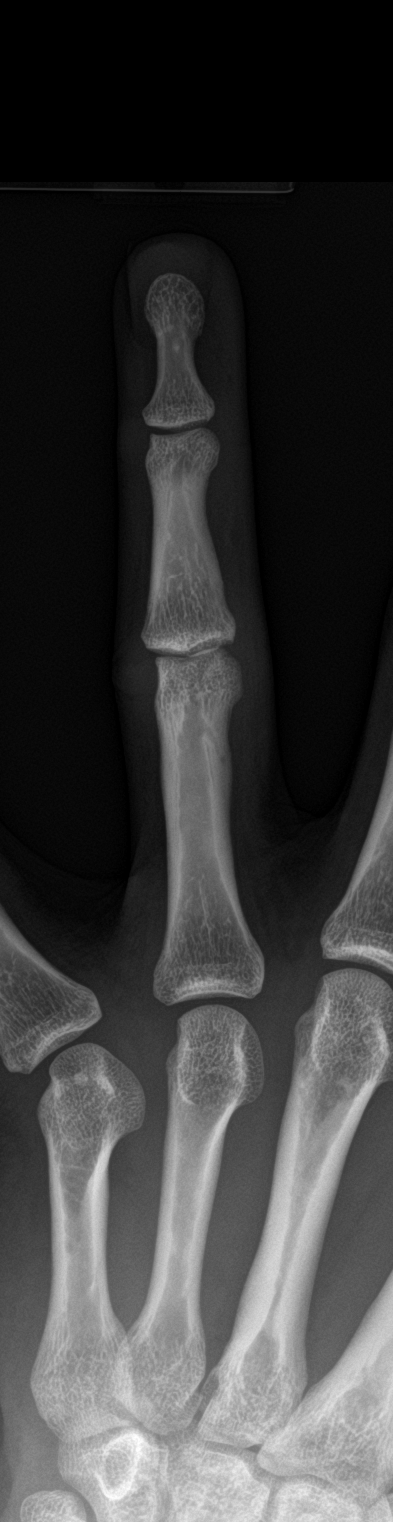

[finger obl]
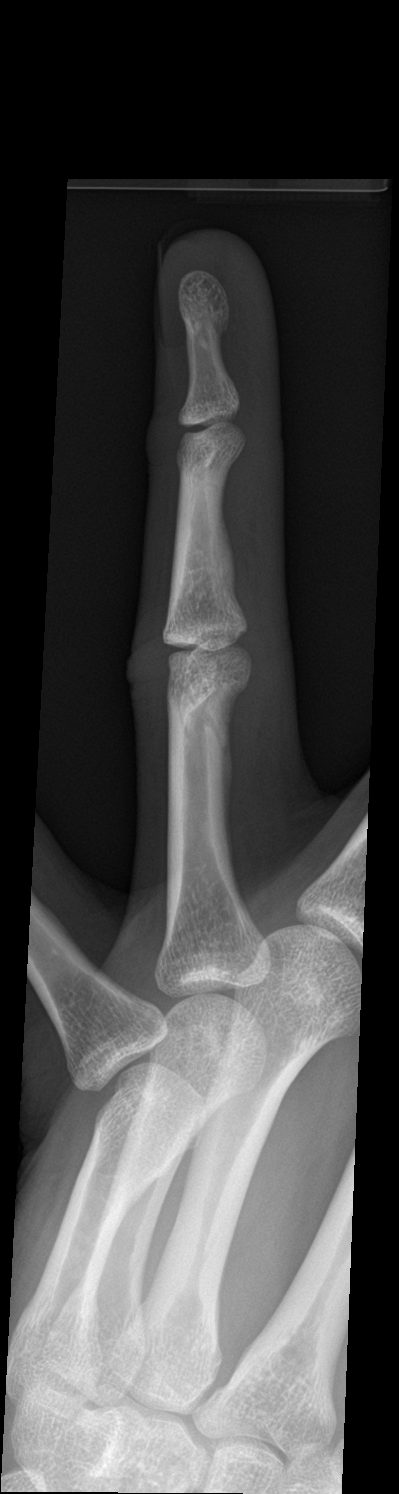

[finger lat]
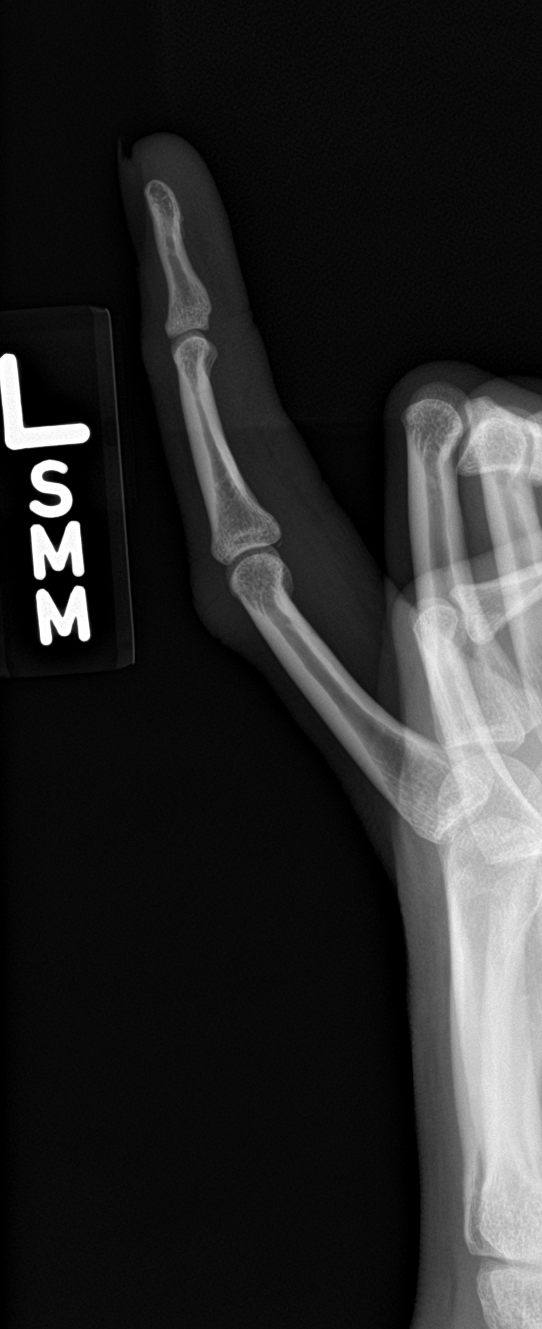

[3 of 3 positions shown; findings below may reference images not displayed]

FINDINGS: The joint spaces are maintained.  No acute fracture is identified.
IMPRESSION: No acute bony findings.

## 2021-05-27 ENCOUNTER — Ambulatory Visit (INDEPENDENT_AMBULATORY_CARE_PROVIDER_SITE_OTHER): Payer: BC Managed Care – PPO | Admitting: Internal Medicine

## 2021-05-27 ENCOUNTER — Other Ambulatory Visit: Payer: Self-pay

## 2021-05-27 ENCOUNTER — Encounter: Payer: Self-pay | Admitting: Internal Medicine

## 2021-05-27 VITALS — BP 126/80 | HR 60 | Temp 97.8°F | Ht 71.0 in | Wt 236.0 lb

## 2021-05-27 DIAGNOSIS — H6123 Impacted cerumen, bilateral: Secondary | ICD-10-CM | POA: Insufficient documentation

## 2021-05-27 DIAGNOSIS — Z0000 Encounter for general adult medical examination without abnormal findings: Secondary | ICD-10-CM

## 2021-05-27 DIAGNOSIS — L910 Hypertrophic scar: Secondary | ICD-10-CM | POA: Diagnosis not present

## 2021-05-27 DIAGNOSIS — Z1159 Encounter for screening for other viral diseases: Secondary | ICD-10-CM | POA: Insufficient documentation

## 2021-05-27 DIAGNOSIS — H9193 Unspecified hearing loss, bilateral: Secondary | ICD-10-CM | POA: Diagnosis not present

## 2021-05-27 DIAGNOSIS — Z0001 Encounter for general adult medical examination with abnormal findings: Secondary | ICD-10-CM | POA: Insufficient documentation

## 2021-05-27 DIAGNOSIS — L659 Nonscarring hair loss, unspecified: Secondary | ICD-10-CM | POA: Diagnosis not present

## 2021-05-27 LAB — CBC WITH DIFFERENTIAL/PLATELET
Basophils Absolute: 0 10*3/uL (ref 0.0–0.1)
Basophils Relative: 0.8 % (ref 0.0–3.0)
Eosinophils Absolute: 0 10*3/uL (ref 0.0–0.7)
Eosinophils Relative: 1 % (ref 0.0–5.0)
HCT: 41.8 % (ref 39.0–52.0)
Hemoglobin: 13.7 g/dL (ref 13.0–17.0)
Lymphocytes Relative: 37.1 % (ref 12.0–46.0)
Lymphs Abs: 1.4 10*3/uL (ref 0.7–4.0)
MCHC: 32.7 g/dL (ref 30.0–36.0)
MCV: 85.5 fl (ref 78.0–100.0)
Monocytes Absolute: 0.3 10*3/uL (ref 0.1–1.0)
Monocytes Relative: 8.4 % (ref 3.0–12.0)
Neutro Abs: 2 10*3/uL (ref 1.4–7.7)
Neutrophils Relative %: 52.7 % (ref 43.0–77.0)
Platelets: 186 10*3/uL (ref 150.0–400.0)
RBC: 4.9 Mil/uL (ref 4.22–5.81)
RDW: 13.7 % (ref 11.5–15.5)
WBC: 3.9 10*3/uL — ABNORMAL LOW (ref 4.0–10.5)

## 2021-05-27 LAB — BASIC METABOLIC PANEL
BUN: 14 mg/dL (ref 6–23)
CO2: 28 mEq/L (ref 19–32)
Calcium: 9.4 mg/dL (ref 8.4–10.5)
Chloride: 102 mEq/L (ref 96–112)
Creatinine, Ser: 1.18 mg/dL (ref 0.40–1.50)
GFR: 84.69 mL/min (ref >60.00)
Glucose, Bld: 94 mg/dL (ref 70–99)
Potassium: 4 mEq/L (ref 3.5–5.1)
Sodium: 137 mEq/L (ref 135–145)

## 2021-05-27 LAB — TSH: TSH: 2.75 u[IU]/mL (ref 0.35–5.50)

## 2021-05-27 NOTE — Progress Notes (Signed)
Subjective:  Patient ID: Manuel Bolton, male    DOB: 1995-11-29  Age: 25 y.o. MRN: [id number]  CC: Annual Exam  This visit occurred during the SARS-CoV-2 public health emergency.  Safety protocols were in place, including screening questions prior to the visit, additional usage of staff PPE, and extensive cleaning of exam room while observing appropriate contact time as indicated for disinfecting solutions.    HPI Jakayden Cancio presents for a CPX and to establish.  His mother complains that he does not hear well.  He denies ear pain, headache, dizziness, or lightheadedness.  He tells me that he has had alopecia since childhood.  History Shi has no past medical history on file.   He has no past surgical history on file.   His family history includes Cancer in an other family member; Diabetes in an other family member; Healthy in his father and mother; Stroke in an other family member.He reports that he has never smoked. He has never used smokeless tobacco. He reports that he does not drink alcohol and does not use drugs.  Outpatient Medications Prior to Visit  Medication Sig Dispense Refill   naproxen (NAPROSYN) 500 MG tablet Take 1 tablet (500 mg total) by mouth 2 (two) times daily with a meal. 20 tablet 0   neomycin-polymyxin-hydrocortisone (CORTISPORIN) 3.5-10000-1 OTIC suspension Place 4 drops into the left ear 4 (four) times daily. 10 mL 0   No facility-administered medications prior to visit.    ROS Review of Systems  Constitutional: Negative.  Negative for diaphoresis, fatigue and unexpected weight change.  HENT:  Positive for hearing loss. Negative for ear pain.   Respiratory:  Negative for cough, chest tightness, shortness of breath and wheezing.   Cardiovascular:  Negative for chest pain, palpitations and leg swelling.  Gastrointestinal:  Negative for abdominal pain, constipation, diarrhea, nausea and vomiting.  Endocrine: Negative.   Genitourinary: Negative.   Negative for difficulty urinating, scrotal swelling, testicular pain and urgency.  Musculoskeletal:  Negative for arthralgias and myalgias.  Skin: Negative.   Neurological: Negative.  Negative for dizziness, weakness and light-headedness.  Hematological:  Negative for adenopathy. Does not bruise/bleed easily.  Psychiatric/Behavioral: Negative.     Objective:  BP 126/80 (BP Location: Left Arm, Patient Position: Sitting, Cuff Size: Large)   Pulse 60   Temp 97.8 F (36.6 C) (Oral)   Ht 5\' 11"  (1.803 m)   Wt 236 lb (107 kg)   SpO2 98%   BMI 32.92 kg/m   Physical Exam Vitals reviewed.  HENT:     Right Ear: Decreased hearing noted. There is impacted cerumen. No foreign body.     Left Ear: Decreased hearing noted. There is impacted cerumen. No foreign body.     Ears:     Comments: I put Colace in both ears and then irrigated them using water and an ear pick.  The cerumen was successfully removed.  He tolerated this well.  The examination afterwards is normal.    Nose: Nose normal.     Mouth/Throat:     Mouth: Mucous membranes are moist.  Eyes:     General: No scleral icterus.    Conjunctiva/sclera: Conjunctivae normal.  Cardiovascular:     Rate and Rhythm: Normal rate and regular rhythm.     Heart sounds: No murmur heard. Pulmonary:     Effort: Pulmonary effort is normal.     Breath sounds: No stridor. No wheezing, rhonchi or rales.  Abdominal:     General: Abdomen is  flat.     Palpations: There is no mass.     Tenderness: There is no abdominal tenderness. There is no guarding.     Hernia: No hernia is present.  Musculoskeletal:        General: Normal range of motion.     Cervical back: Neck supple.     Right lower leg: No edema.     Left lower leg: No edema.  Lymphadenopathy:     Cervical: No cervical adenopathy.  Skin:    General: Skin is warm and dry.  Neurological:     General: No focal deficit present.     Mental Status: He is alert.    Lab Results  Component  Value Date   WBC 3.9 (L) 05/27/2021   HGB 13.7 05/27/2021   HCT 41.8 05/27/2021   PLT 186.0 05/27/2021   GLUCOSE 94 05/27/2021   CHOL 199 05/12/2018   TRIG 66 05/12/2018   HDL 71 (A) 05/12/2018   LDLCALC 114 05/12/2018   NA 137 05/27/2021   K 4.0 05/27/2021   CL 102 05/27/2021   CREATININE 1.18 05/27/2021   BUN 14 05/27/2021   CO2 28 05/27/2021   TSH 2.75 05/27/2021     Assessment & Plan:   Keane was seen today for annual exam.  Diagnoses and all orders for this visit:  Hearing loss of both ears due to cerumen impaction- The cerumen was successfully removed. -     CBC with Differential/Platelet; Future -     Basic metabolic panel; Future -     TSH; Future -     RPR; Future -     RPR -     TSH -     Basic metabolic panel -     CBC with Differential/Platelet  Scarring, keloid -     Basic metabolic panel; Future -     RPR; Future -     RPR -     Basic metabolic panel  Alopecia of scalp- Labs are negative for secondary causes.  I recommended that he see dermatology about this. -     Ambulatory referral to Dermatology -     CBC with Differential/Platelet; Future -     Basic metabolic panel; Future -     TSH; Future -     RPR; Future -     RPR -     TSH -     Basic metabolic panel -     CBC with Differential/Platelet  Encounter for general adult medical examination with abnormal findings- Exam completed, labs reviewed, he deferred on the flu vaccine, no cancer screenings indicated. -     Hepatitis C antibody; Future -     HIV Antibody (routine testing w rflx); Future -     HIV Antibody (routine testing w rflx) -     Hepatitis C antibody  Need for hepatitis C screening test -     Hepatitis C antibody; Future -     Hepatitis C antibody  Bilateral hearing loss, unspecified hearing loss type- I recommended that he be evaluated for hearing loss. -     Ambulatory referral to Audiology  I am having Salvadore Dom maintain his neomycin-polymyxin-hydrocortisone and  naproxen.  No orders of the defined types were placed in this encounter.    Follow-up: Return in about 6 months (around 11/24/2021).  Sanda Linger, MD

## 2021-05-27 NOTE — Patient Instructions (Signed)
Health Maintenance, Male Adopting a healthy lifestyle and getting preventive care are important in promoting health and wellness. Ask your health care provider about: The right schedule for you to have regular tests and exams. Things you can do on your own to prevent diseases and keep yourself healthy. What should I know about diet, weight, and exercise? Eat a healthy diet  Eat a diet that includes plenty of vegetables, fruits, low-fat dairy products, and lean protein. Do not eat a lot of foods that are high in solid fats, added sugars, or sodium. Maintain a healthy weight Body mass index (BMI) is a measurement that can be used to identify possible weight problems. It estimates body fat based on height and weight. Your health care provider can help determine your BMI and help you achieve or maintain a healthy weight. Get regular exercise Get regular exercise. This is one of the most important things you can do for your health. Most adults should: Exercise for at least 150 minutes each week. The exercise should increase your heart rate and make you sweat (moderate-intensity exercise). Do strengthening exercises at least twice a week. This is in addition to the moderate-intensity exercise. Spend less time sitting. Even light physical activity can be beneficial. Watch cholesterol and blood lipids Have your blood tested for lipids and cholesterol at 27 years of age, then have this test every 5 years. You may need to have your cholesterol levels checked more often if: Your lipid or cholesterol levels are high. You are older than 27 years of age. You are at high risk for heart disease. What should I know about cancer screening? Many types of cancers can be detected early and may often be prevented. Depending on your health history and family history, you may need to have cancer screening at various ages. This may include screening for: Colorectal cancer. Prostate cancer. Skin cancer. Lung  cancer. What should I know about heart disease, diabetes, and high blood pressure? Blood pressure and heart disease High blood pressure causes heart disease and increases the risk of stroke. This is more likely to develop in people who have high blood pressure readings, are of African descent, or are overweight. Talk with your health care provider about your target blood pressure readings. Have your blood pressure checked: Every 3-5 years if you are 18-39 years of age. Every year if you are 40 years old or older. If you are between the ages of 65 and 75 and are a current or former smoker, ask your health care provider if you should have a one-time screening for abdominal aortic aneurysm (AAA). Diabetes Have regular diabetes screenings. This checks your fasting blood sugar level. Have the screening done: Once every three years after age 45 if you are at a normal weight and have a low risk for diabetes. More often and at a younger age if you are overweight or have a high risk for diabetes. What should I know about preventing infection? Hepatitis B If you have a higher risk for hepatitis B, you should be screened for this virus. Talk with your health care provider to find out if you are at risk for hepatitis B infection. Hepatitis C Blood testing is recommended for: Everyone born from 1945 through 1965. Anyone with known risk factors for hepatitis C. Sexually transmitted infections (STIs) You should be screened each year for STIs, including gonorrhea and chlamydia, if: You are sexually active and are younger than 27 years of age. You are older than 27 years   of age and your health care provider tells you that you are at risk for this type of infection. Your sexual activity has changed since you were last screened, and you are at increased risk for chlamydia or gonorrhea. Ask your health care provider if you are at risk. Ask your health care provider about whether you are at high risk for HIV.  Your health care provider may recommend a prescription medicine to help prevent HIV infection. If you choose to take medicine to prevent HIV, you should first get tested for HIV. You should then be tested every 3 months for as long as you are taking the medicine. Follow these instructions at home: Lifestyle Do not use any products that contain nicotine or tobacco, such as cigarettes, e-cigarettes, and chewing tobacco. If you need help quitting, ask your health care provider. Do not use street drugs. Do not share needles. Ask your health care provider for help if you need support or information about quitting drugs. Alcohol use Do not drink alcohol if your health care provider tells you not to drink. If you drink alcohol: Limit how much you have to 0-2 drinks a day. Be aware of how much alcohol is in your drink. In the [place] one drink equals one 12 oz bottle of beer (355 mL), one 5 oz glass of wine (148 mL), or one 1 oz glass of hard liquor (44 mL). General instructions Schedule regular health, dental, and eye exams. Stay current with your vaccines. Tell your health care provider if: You often feel depressed. You have ever been abused or do not feel safe at home. Summary Adopting a healthy lifestyle and getting preventive care are important in promoting health and wellness. Follow your health care provider's instructions about healthy diet, exercising, and getting tested or screened for diseases. Follow your health care provider's instructions on monitoring your cholesterol and blood pressure. This information is not intended to replace advice given to you by your health care provider. Make sure you discuss any questions you have with your health care provider. Document Revised: 11/02/2020 Document Reviewed: 08/18/2018 Elsevier Patient Education  2022 [employer]vier Inc[employer]  

## 2021-05-28 ENCOUNTER — Encounter: Payer: Self-pay | Admitting: Internal Medicine

## 2021-05-28 DIAGNOSIS — H9193 Unspecified hearing loss, bilateral: Secondary | ICD-10-CM | POA: Insufficient documentation

## 2021-05-28 LAB — HIV ANTIBODY (ROUTINE TESTING W REFLEX): HIV 1&2 Ab, 4th Generation: NONREACTIVE

## 2021-05-28 LAB — RPR: RPR Ser Ql: NONREACTIVE

## 2021-05-28 LAB — HEPATITIS C ANTIBODY
Hepatitis C Ab: NONREACTIVE
SIGNAL TO CUT-OFF: 0.01 (ref <1.00)

## 2021-12-10 ENCOUNTER — Ambulatory Visit: Payer: BC Managed Care – PPO | Admitting: Dermatology

## 2022-07-15 ENCOUNTER — Other Ambulatory Visit: Payer: Self-pay | Admitting: Internal Medicine

## 2022-07-15 ENCOUNTER — Ambulatory Visit (INDEPENDENT_AMBULATORY_CARE_PROVIDER_SITE_OTHER): Payer: BC Managed Care – PPO | Admitting: Internal Medicine

## 2022-07-15 ENCOUNTER — Encounter: Payer: Self-pay | Admitting: Internal Medicine

## 2022-07-15 ENCOUNTER — Telehealth: Payer: Self-pay | Admitting: Internal Medicine

## 2022-07-15 ENCOUNTER — Ambulatory Visit (INDEPENDENT_AMBULATORY_CARE_PROVIDER_SITE_OTHER): Payer: BC Managed Care – PPO

## 2022-07-15 VITALS — BP 122/72 | HR 67 | Temp 98.4°F | Ht 71.0 in | Wt 255.0 lb

## 2022-07-15 DIAGNOSIS — J22 Unspecified acute lower respiratory infection: Secondary | ICD-10-CM | POA: Diagnosis not present

## 2022-07-15 DIAGNOSIS — R058 Other specified cough: Secondary | ICD-10-CM

## 2022-07-15 DIAGNOSIS — Z23 Encounter for immunization: Secondary | ICD-10-CM | POA: Diagnosis not present

## 2022-07-15 MED ORDER — HYDROCODONE BIT-HOMATROP MBR 5-1.5 MG/5ML PO SOLN: 5.0000 mL | Freq: Three times a day (TID) | ORAL | 0 refills | Status: DC | PRN

## 2022-07-15 MED ORDER — AZITHROMYCIN 500 MG PO TABS: 500.0000 mg | ORAL_TABLET | Freq: Every day | ORAL | 0 refills | Status: DC

## 2022-07-15 MED ORDER — HYDROCODONE BIT-HOMATROP MBR 5-1.5 MG/5ML PO SOLN: 5.0000 mL | Freq: Three times a day (TID) | ORAL | 0 refills | Status: AC | PRN

## 2022-07-15 MED ORDER — AZITHROMYCIN 500 MG PO TABS: 500.0000 mg | ORAL_TABLET | Freq: Every day | ORAL | 0 refills | Status: AC

## 2022-07-15 NOTE — Progress Notes (Signed)
Subjective:  Patient ID: Manuel Bolton, male    DOB: 1996-04-16  Age: 26 y.o. MRN: [id number]  CC: Cough   HPI Manuel Bolton presents for f/up -  He complains of a one week hx of cough productive of think yellow phlegm.  History Manuel Bolton has no past medical history on file.   He has no past surgical history on file.   His family history includes Cancer in an other family member; Diabetes in an other family member; Healthy in his father and mother; Stroke in an other family member.He reports that he has never smoked. He has never used smokeless tobacco. He reports that he does not drink alcohol and does not use drugs.  Outpatient Medications Prior to Visit  Medication Sig Dispense Refill   naproxen (NAPROSYN) 500 MG tablet Take 1 tablet (500 mg total) by mouth 2 (two) times daily with a meal. 20 tablet 0   neomycin-polymyxin-hydrocortisone (CORTISPORIN) 3.5-10000-1 OTIC suspension Place 4 drops into the left ear 4 (four) times daily. 10 mL 0   No facility-administered medications prior to visit.    ROS Review of Systems  Constitutional:  Positive for unexpected weight change. Negative for appetite change, chills, diaphoresis and fatigue.       He tells me that he is gaining weight because he eats junk food  HENT:  Positive for sore throat. Negative for trouble swallowing.   Respiratory:  Positive for cough. Negative for chest tightness, shortness of breath and wheezing.   Cardiovascular:  Negative for chest pain, palpitations and leg swelling.  Gastrointestinal:  Negative for abdominal pain, diarrhea, nausea and vomiting.  Genitourinary: Negative.   Musculoskeletal: Negative.   Skin: Negative.  Negative for rash.  Neurological: Negative.   Hematological:  Negative for adenopathy. Does not bruise/bleed easily.  Psychiatric/Behavioral: Negative.  Negative for self-injury.     Objective:  BP 122/72 (BP Location: Left Arm, Patient Position: Sitting, Cuff Size: Large)   Pulse  67   Temp 98.4 F (36.9 C) (Oral)   Ht 5\' 11"  (1.803 m)   Wt 255 lb (115.7 kg)   SpO2 97%   BMI 35.57 kg/m   Physical Exam Vitals reviewed.  Constitutional:      Appearance: Normal appearance.  HENT:     Mouth/Throat:     Mouth: Mucous membranes are moist.     Pharynx: No pharyngeal swelling, oropharyngeal exudate or posterior oropharyngeal erythema.  Eyes:     General: No scleral icterus.    Conjunctiva/sclera: Conjunctivae normal.  Cardiovascular:     Rate and Rhythm: Normal rate and regular rhythm.     Heart sounds: No murmur heard. Pulmonary:     Effort: Pulmonary effort is normal. No tachypnea or respiratory distress.     Breath sounds: No stridor. No decreased breath sounds, wheezing, rhonchi or rales.  Abdominal:     General: Abdomen is flat.     Palpations: There is no mass.     Tenderness: There is no abdominal tenderness. There is no guarding.     Hernia: No hernia is present.  Musculoskeletal:        General: Normal range of motion.     Cervical back: Neck supple.     Right lower leg: No edema.     Left lower leg: No edema.  Lymphadenopathy:     Cervical: No cervical adenopathy.  Skin:    General: Skin is warm and dry.  Neurological:     General: No focal deficit present.  Lab Results  Component Value Date   WBC 3.9 (L) 05/27/2021   HGB 13.7 05/27/2021   HCT 41.8 05/27/2021   PLT 186.0 05/27/2021   GLUCOSE 94 05/27/2021   CHOL 199 05/12/2018   TRIG 66 05/12/2018   HDL 71 (A) 05/12/2018   LDLCALC 114 05/12/2018   NA 137 05/27/2021   K 4.0 05/27/2021   CL 102 05/27/2021   CREATININE 1.18 05/27/2021   BUN 14 05/27/2021   CO2 28 05/27/2021   TSH 2.75 05/27/2021     DG Chest 2 View  Result Date: 07/15/2022 CLINICAL DATA:  Cough productive of yellow phlegm for 1 week, congestion, nonsmoker EXAM: CHEST - 2 VIEW COMPARISON:  02/10/2007 FINDINGS: Normal heart size, mediastinal contours, and pulmonary vascularity. Lungs clear. No pulmonary  infiltrate, pleural effusion, or pneumothorax. Osseous structures unremarkable. IMPRESSION: No acute abnormalities. Electronically Signed   By: Lavonia Dana M.D.   On: 07/15/2022 11:12     Assessment & Plan:   Manuel Bolton was seen today for cough.  Diagnoses and all orders for this visit:  Cough productive of purulent sputum- CXR is negative for infiltrate. Will treat for bacterial LRTI. -     DG Chest 2 View; Future -     HYDROcodone bit-homatropine (HYCODAN) 5-1.5 MG/5ML syrup; Take 5 mLs by mouth every 8 (eight) hours as needed for up to 8 days for cough.  LRTI (lower respiratory tract infection) -     azithromycin (ZITHROMAX) 500 MG tablet; Take 1 tablet (500 mg total) by mouth daily for 3 days. -     HYDROcodone bit-homatropine (HYCODAN) 5-1.5 MG/5ML syrup; Take 5 mLs by mouth every 8 (eight) hours as needed for up to 8 days for cough.  Other orders -     Flu Vaccine QUAD 6+ mos PF IM (Fluarix Quad PF)   I am having Manuel Bolton start on azithromycin and HYDROcodone bit-homatropine. I am also having him maintain his neomycin-polymyxin-hydrocortisone and naproxen.  Meds ordered this encounter  Medications   azithromycin (ZITHROMAX) 500 MG tablet    Sig: Take 1 tablet (500 mg total) by mouth daily for 3 days.    Dispense:  3 tablet    Refill:  0   HYDROcodone bit-homatropine (HYCODAN) 5-1.5 MG/5ML syrup    Sig: Take 5 mLs by mouth every 8 (eight) hours as needed for up to 8 days for cough.    Dispense:  120 mL    Refill:  0     Follow-up: Return in about 3 months (around 10/15/2022).  Scarlette Calico, MD

## 2022-07-15 NOTE — Telephone Encounter (Signed)
Patient called back and asked if the meds sent in today: azithromycin (ZITHROMAX) 500 MG tablet and HYDROcodone bit-homatropine (HYCODAN) 5-1.5 MG/5ML syrup   Can be sent to [facility] on [address] instead

## 2022-07-15 NOTE — Patient Instructions (Signed)

## 2022-10-14 ENCOUNTER — Ambulatory Visit (INDEPENDENT_AMBULATORY_CARE_PROVIDER_SITE_OTHER): Payer: BC Managed Care – PPO | Admitting: Internal Medicine

## 2022-10-14 ENCOUNTER — Encounter: Payer: Self-pay | Admitting: Internal Medicine

## 2022-10-14 VITALS — BP 128/82 | HR 74 | Temp 97.7°F | Ht 71.0 in | Wt 249.0 lb

## 2022-10-14 DIAGNOSIS — H6121 Impacted cerumen, right ear: Secondary | ICD-10-CM | POA: Diagnosis not present

## 2022-10-14 DIAGNOSIS — Z0001 Encounter for general adult medical examination with abnormal findings: Secondary | ICD-10-CM | POA: Diagnosis not present

## 2022-10-14 DIAGNOSIS — Z0000 Encounter for general adult medical examination without abnormal findings: Secondary | ICD-10-CM

## 2022-10-14 NOTE — Progress Notes (Signed)
Subjective:  Patient ID: Manuel Bolton, male    DOB: 03-19-1996  Age: 27 y.o. MRN: [id number]  CC: Annual Exam   HPI Manuel Bolton presents for a CPX and f/up ------  He complains of a several week history of decreased hearing on the right side.  No outpatient medications prior to visit.   No facility-administered medications prior to visit.    ROS Review of Systems  Constitutional: Negative.  Negative for appetite change, diaphoresis, fatigue and unexpected weight change.  HENT:  Positive for ear pain and hearing loss. Negative for ear discharge and sinus pressure.   Eyes: Negative.   Respiratory:  Negative for cough, chest tightness, shortness of breath and wheezing.   Cardiovascular:  Negative for chest pain, palpitations and leg swelling.  Gastrointestinal:  Negative for abdominal pain, diarrhea and vomiting.  Endocrine: Negative.   Genitourinary: Negative.  Negative for difficulty urinating, hematuria, penile swelling, scrotal swelling and testicular pain.  Musculoskeletal: Negative.  Negative for back pain.  Skin: Negative.   Neurological: Negative.  Negative for dizziness and weakness.  Hematological:  Negative for adenopathy. Does not bruise/bleed easily.  Psychiatric/Behavioral: Negative.      Objective:  BP 128/82 (BP Location: Left Arm, Patient Position: Sitting, Cuff Size: Normal)   Pulse 74   Temp 97.7 F (36.5 C) (Oral)   Ht 5' 11"$  (1.803 m)   Wt 249 lb (112.9 kg)   SpO2 97%   BMI 34.73 kg/m   BP Readings from Last 3 Encounters:  10/14/22 128/82  07/15/22 122/72  05/27/21 126/80    Wt Readings from Last 3 Encounters:  10/14/22 249 lb (112.9 kg)  07/15/22 255 lb (115.7 kg)  05/27/21 236 lb (107 kg)    Physical Exam Vitals reviewed.  Constitutional:      Appearance: He is not ill-appearing.  HENT:     Right Ear: Tympanic membrane, ear canal and external ear normal. Decreased hearing noted. No middle ear effusion. There is impacted cerumen.  Tympanic membrane is not injected.     Left Ear: Hearing, tympanic membrane, ear canal and external ear normal.     Ears:     Comments: I put Colace in the right EAC and then I irrigated it with water and the cerumen was removed.  He tolerated this well.  The examination afterwards is normal.    Mouth/Throat:     Mouth: Mucous membranes are moist.  Eyes:     General: No scleral icterus.    Conjunctiva/sclera: Conjunctivae normal.  Cardiovascular:     Rate and Rhythm: Normal rate and regular rhythm.     Heart sounds: No murmur heard. Pulmonary:     Effort: Pulmonary effort is normal.     Breath sounds: No stridor. No wheezing, rhonchi or rales.  Abdominal:     General: Abdomen is flat.     Palpations: There is no mass.     Tenderness: There is no abdominal tenderness. There is no guarding.     Hernia: No hernia is present.  Musculoskeletal:        General: Normal range of motion.     Cervical back: Neck supple.     Right lower leg: No edema.     Left lower leg: No edema.  Lymphadenopathy:     Cervical: No cervical adenopathy.  Skin:    General: Skin is warm and dry.  Neurological:     General: No focal deficit present.     Mental Status: He is alert.  Mental status is at baseline.  Psychiatric:        Mood and Affect: Mood normal.        Behavior: Behavior normal.     Lab Results  Component Value Date   WBC 3.9 (L) 05/27/2021   HGB 13.7 05/27/2021   HCT 41.8 05/27/2021   PLT 186.0 05/27/2021   GLUCOSE 94 05/27/2021   CHOL 199 05/12/2018   TRIG 66 05/12/2018   HDL 71 (A) 05/12/2018   LDLCALC 114 05/12/2018   NA 137 05/27/2021   K 4.0 05/27/2021   CL 102 05/27/2021   CREATININE 1.18 05/27/2021   BUN 14 05/27/2021   CO2 28 05/27/2021   TSH 2.75 05/27/2021    DG Finger Ring Left  Result Date: 06/14/2019 CLINICAL DATA:  Box fell on left ring finger.  Pain and swelling. EXAM: LEFT RING FINGER 2+V COMPARISON:  None. FINDINGS: The joint spaces are maintained.  No  acute fracture is identified. IMPRESSION: No acute bony findings. Electronically Signed   By: Marijo Sanes M.D.   On: 06/14/2019 12:02    Assessment & Plan:   Lael was seen today for annual exam.  Diagnoses and all orders for this visit:  Hearing loss secondary to cerumen impaction, right- The cerumen was successfully removed.  Routine general medical examination at a health care facility- Exam completed, no labs indicated, vaccines are up-to-date, no cancer screenings indicated, patient education was given.   Manuel Bolton does not currently have medications on file.  No orders of the defined types were placed in this encounter.    Follow-up: Return in about 1 year (around 10/15/2023).  Manuel Calico, MD

## 2022-10-14 NOTE — Patient Instructions (Signed)
Health Maintenance, Male Adopting a healthy lifestyle and getting preventive care are important in promoting health and wellness. Ask your health care provider about: The right schedule for you to have regular tests and exams. Things you can do on your own to prevent diseases and keep yourself healthy. What should I know about diet, weight, and exercise? Eat a healthy diet  Eat a diet that includes plenty of vegetables, fruits, low-fat dairy products, and lean protein. Do not eat a lot of foods that are high in solid fats, added sugars, or sodium. Maintain a healthy weight Body mass index (BMI) is a measurement that can be used to identify possible weight problems. It estimates body fat based on height and weight. Your health care provider can help determine your BMI and help you achieve or maintain a healthy weight. Get regular exercise Get regular exercise. This is one of the most important things you can do for your health. Most adults should: Exercise for at least 150 minutes each week. The exercise should increase your heart rate and make you sweat (moderate-intensity exercise). Do strengthening exercises at least twice a week. This is in addition to the moderate-intensity exercise. Spend less time sitting. Even light physical activity can be beneficial. Watch cholesterol and blood lipids Have your blood tested for lipids and cholesterol at 29 years of age, then have this test every 5 years. You may need to have your cholesterol levels checked more often if: Your lipid or cholesterol levels are high. You are older than 29 years of age. You are at high risk for heart disease. What should I know about cancer screening? Many types of cancers can be detected early and may often be prevented. Depending on your health history and family history, you may need to have cancer screening at various ages. This may include screening for: Colorectal cancer. Prostate cancer. Skin cancer. Lung  cancer. What should I know about heart disease, diabetes, and high blood pressure? Blood pressure and heart disease High blood pressure causes heart disease and increases the risk of stroke. This is more likely to develop in people who have high blood pressure readings or are overweight. Talk with your health care provider about your target blood pressure readings. Have your blood pressure checked: Every 3-5 years if you are 18-39 years of age. Every year if you are 40 years old or older. If you are between the ages of 65 and 75 and are a current or former smoker, ask your health care provider if you should have a one-time screening for abdominal aortic aneurysm (AAA). Diabetes Have regular diabetes screenings. This checks your fasting blood sugar level. Have the screening done: Once every three years after age 45 if you are at a normal weight and have a low risk for diabetes. More often and at a younger age if you are overweight or have a high risk for diabetes. What should I know about preventing infection? Hepatitis B If you have a higher risk for hepatitis B, you should be screened for this virus. Talk with your health care provider to find out if you are at risk for hepatitis B infection. Hepatitis C Blood testing is recommended for: Everyone born from 1945 through 1965. Anyone with known risk factors for hepatitis C. Sexually transmitted infections (STIs) You should be screened each year for STIs, including gonorrhea and chlamydia, if: You are sexually active and are younger than 29 years of age. You are older than 29 years of age and your   health care provider tells you that you are at risk for this type of infection. Your sexual activity has changed since you were last screened, and you are at increased risk for chlamydia or gonorrhea. Ask your health care provider if you are at risk. Ask your health care provider about whether you are at high risk for HIV. Your health care provider  may recommend a prescription medicine to help prevent HIV infection. If you choose to take medicine to prevent HIV, you should first get tested for HIV. You should then be tested every 3 months for as long as you are taking the medicine. Follow these instructions at home: Alcohol use Do not drink alcohol if your health care provider tells you not to drink. If you drink alcohol: Limit how much you have to 0-2 drinks a day. Know how much alcohol is in your drink. In the [place] one drink equals one 12 oz bottle of beer (355 mL), one 5 oz glass of wine (148 mL), or one 1 oz glass of hard liquor (44 mL). Lifestyle Do not use any products that contain nicotine or tobacco. These products include cigarettes, chewing tobacco, and vaping devices, such as e-cigarettes. If you need help quitting, ask your health care provider. Do not use street drugs. Do not share needles. Ask your health care provider for help if you need support or information about quitting drugs. General instructions Schedule regular health, dental, and eye exams. Stay current with your vaccines. Tell your health care provider if: You often feel depressed. You have ever been abused or do not feel safe at home. Summary Adopting a healthy lifestyle and getting preventive care are important in promoting health and wellness. Follow your health care provider's instructions about healthy diet, exercising, and getting tested or screened for diseases. Follow your health care provider's instructions on monitoring your cholesterol and blood pressure. This information is not intended to replace advice given to you by your health care provider. Make sure you discuss any questions you have with your health care provider. Document Revised: 01/14/2021 Document Reviewed: 01/14/2021 Elsevier Patient Education  2023 [employer]  

## 2022-10-14 NOTE — Progress Notes (Signed)
PRE-PROCEDURE EXAM: Left TM cannot be visualized due to total occlusion/impaction of the ear canal.  PROCEDURE INDICATION: remove wax to visualize ear drum & relieve discomfort  CONSENT:  Verbal     PROCEDURE NOTE:     LEFT EAR:  I used warm water irrigation under direct visualization with the otoscope to free the wax bolus from the ear canal.    POST- PROCEDURE EXAM: TMs successfully visualized and found to have no erythema     The patient tolerated the procedure well.

## 2023-05-31 ENCOUNTER — Other Ambulatory Visit: Payer: Self-pay

## 2023-05-31 ENCOUNTER — Emergency Department (HOSPITAL_COMMUNITY)
Admission: EM | Admit: 2023-05-31 | Discharge: 2023-05-31 | Disposition: A | Discharge status: Home / Self Care | Payer: BC Managed Care – PPO | Attending: Emergency Medicine | Admitting: Emergency Medicine

## 2023-05-31 ENCOUNTER — Encounter (HOSPITAL_COMMUNITY): Payer: Self-pay

## 2023-05-31 ENCOUNTER — Emergency Department (HOSPITAL_COMMUNITY): Payer: BC Managed Care – PPO

## 2023-05-31 DIAGNOSIS — R079 Chest pain, unspecified: Secondary | ICD-10-CM | POA: Insufficient documentation

## 2023-05-31 LAB — CBC
HCT: 40.9 % (ref 39.0–52.0)
Hemoglobin: 13.1 g/dL (ref 13.0–17.0)
MCH: 27.6 pg (ref 26.0–34.0)
MCHC: 32 g/dL (ref 30.0–36.0)
MCV: 86.3 fL (ref 80.0–100.0)
Platelets: 190 10*3/uL (ref 150–400)
RBC: 4.74 MIL/uL (ref 4.22–5.81)
RDW: 12.6 % (ref 11.5–15.5)
WBC: 4.4 10*3/uL (ref 4.0–10.5)
nRBC: 0 % (ref 0.0–0.2)

## 2023-05-31 LAB — TROPONIN I (HIGH SENSITIVITY): Troponin I (High Sensitivity): <2 ng/L (ref <18)

## 2023-05-31 LAB — BASIC METABOLIC PANEL
Anion gap: 12 (ref 5–15)
BUN: 11 mg/dL (ref 6–20)
CO2: 23 mmol/L (ref 22–32)
Calcium: 8.9 mg/dL (ref 8.9–10.3)
Chloride: 103 mmol/L (ref 98–111)
Creatinine, Ser: 1.27 mg/dL — ABNORMAL HIGH (ref 0.61–1.24)
GFR, Estimated: >60 mL/min (ref >60)
Glucose, Bld: 95 mg/dL (ref 70–99)
Potassium: 3.9 mmol/L (ref 3.5–5.1)
Sodium: 138 mmol/L (ref 135–145)

## 2023-05-31 NOTE — ED Provider Notes (Cosign Needed Addendum)
[place] EMERGENCY DEPARTMENT AT [facility] Provider Note   CSN: [id number] Arrival date & time: 05/31/23  1310     History  Chief Complaint  Patient presents with   Chest Pain    Manuel Bolton is a 27 y.o. male.  Patient is a 27 year old male with no significant past medical history presenting to the emergency department with chest pain.  Patient states that he woke up around 930 this morning with some right sided chest pain.  He states that it feels like "he was punched in the chest".  He states that he took some Motrin around 11:30 AM and that the chest pain has been slowly improving since.  He denies any associated shortness of breath, lightheadedness or dizziness, fevers or cough, lower extremity swelling.  He denies any history of blood clots, recent hospitalization or surgery, recent long travel in the car or plane, hormone use or cancer history.  He denies any family history of early cardiac disease.  The history is provided by the patient.  Chest Pain      Home Medications Prior to Admission medications   Not on File      Allergies    Patient has no known allergies.    Review of Systems   Review of Systems  Cardiovascular:  Positive for chest pain.    Physical Exam Updated Vital Signs BP 104/74   Pulse (!) 58   Temp 98.1 F (36.7 C) (Oral)   Resp (!) 21   Ht 5\' 11"  (1.803 m)   Wt 112.9 kg   SpO2 98%   BMI 34.71 kg/m  Physical Exam Vitals and nursing note reviewed.  Constitutional:      General: He is not in acute distress. HENT:     Head: Normocephalic and atraumatic.  Eyes:     Extraocular Movements: Extraocular movements intact.  Cardiovascular:     Rate and Rhythm: Normal rate and regular rhythm.     Heart sounds: Normal heart sounds.  Pulmonary:     Effort: Pulmonary effort is normal.     Breath sounds: Normal breath sounds.  Chest:     Chest wall: No tenderness.  Abdominal:     Tenderness: There is no abdominal tenderness.   Musculoskeletal:        General: Normal range of motion.     Cervical back: Normal range of motion and neck supple.     Right lower leg: No edema.     Left lower leg: No edema.  Skin:    General: Skin is warm and dry.  Neurological:     General: No focal deficit present.     Mental Status: He is alert and oriented to person, place, and time.  Psychiatric:        Mood and Affect: Mood normal.        Behavior: Behavior normal.     ED Results / Procedures / Treatments   Labs (all labs ordered are listed, but only abnormal results are displayed) Labs Reviewed  BASIC METABOLIC PANEL - Abnormal; Notable for the following components:      Result Value   Creatinine, Ser 1.27 (*)    All other components within normal limits  CBC  TROPONIN I (HIGH SENSITIVITY)    EKG EKG Interpretation Date/Time:  Sunday May 31 2023 13:15:49 EDT Ventricular Rate:  68 PR Interval:  142 QRS Duration:  82 QT Interval:  372 QTC Calculation: 395 R Axis:   18  Text Interpretation:  Normal sinus rhythm with sinus arrhythmia Normal ECG No previous ECGs available Confirmed by Elayne Snare ([id number]) on 05/31/2023 1:58:57 PM  Radiology DG Chest 2 View  Result Date: 05/31/2023 CLINICAL DATA:  Chest pain EXAM: CHEST - 2 VIEW COMPARISON:  July 15, 2022 FINDINGS: The cardiomediastinal silhouette is normal in contour. No pleural effusion. No pneumothorax. No acute pleuroparenchymal abnormality. Visualized abdomen is unremarkable. No acute osseous abnormality noted. IMPRESSION: No acute cardiopulmonary abnormality. Electronically Signed   By: Meda Klinefelter M.D.   On: 05/31/2023 13:54    Procedures Procedures    Medications Ordered in ED Medications - No data to display  ED Course/ Medical Decision Making/ A&P Clinical Course as of 05/31/23 1447  Sun May 31, 2023  1426 Mild increased Cr, otherwise within normal range. Troponin is negative. Drawn 4 hours after start of symptoms so single  troponin is sufficient. Patient is stable for discharge home with outpatient follow up. [VK]    Clinical Course User Index [VK] Rexford Maus, DO                                 Medical Decision Making This patient presents to the ED with chief complaint(s) of chest pain with no pertinent past medical history which further complicates the presenting complaint. The complaint involves an extensive differential diagnosis and also carries with it a high risk of complications and morbidity.    The differential diagnosis includes ACS, arrhythmia, anemia, pneumonia or pneumothorax, pulmonary edema, pleural effusion, costochondritis, GERD, gastritis  Additional history obtained: Additional history obtained from family Records reviewed Primary Care Documents  ED Course and Reassessment: On patient's arrival he is hemodynamically stable in no acute distress.  EKG on arrival showed normal sinus rhythm without acute ischemic changes.  He was initially evaluated by triage and had labs including troponin and chest x-ray ordered.  Chest x-ray showed no acute disease, remainder of labs are pending at this time.  The patient reports the pain is only about a 2 out of 10 at this time and declines additional pain medication.  He is PERC negative making PE unlikely.  The patient will be closely reassessed.  Independent labs interpretation:  The following labs were independently interpreted: Within normal range  Independent visualization of imaging: - I independently visualized the following imaging with scope of interpretation limited to determining acute life threatening conditions related to emergency care: Chest x-ray, which revealed no acute disease  Consultation: - Consulted or discussed management/test interpretation w/ external professional: N/A  Consideration for admission or further workup: Patient has no emergent conditions requiring admission or further work-up at this time and is stable for  discharge home with primary care follow-up  Social Determinants of health: N/A    Amount and/or Complexity of Data Reviewed Labs: ordered. Radiology: ordered.          Final Clinical Impression(s) / ED Diagnoses Final diagnoses:  Nonspecific chest pain    Rx / DC Orders ED Discharge Orders     None         Rexford Maus, DO 05/31/23 1447    Rexford Maus, DO 06/11/23 0826    Rexford Maus, DO 06/16/23 1455    Elayne Snare K, DO 06/22/23 4098    Rexford Maus, DO 06/29/23 1302

## 2023-05-31 NOTE — Discharge Instructions (Signed)
You were seen in the emergency department for your chest pain.  Your workup showed no signs of heart attack or stress on your heart.  You can follow-up with your primary doctor in the next few days to have your symptoms rechecked and continue to take Tylenol or Motrin as needed for pain.  You should return to the emergency department if you have significantly worsening pain, severe shortness of breath, you pass out or if you have any other new or concerning symptoms.

## 2023-05-31 NOTE — ED Triage Notes (Signed)
Pt c/o right sided chest pain started this morning. Pt denies N/V, SOB

## 2023-06-02 ENCOUNTER — Encounter (HOSPITAL_COMMUNITY): Payer: Self-pay | Admitting: Emergency Medicine

## 2023-06-02 ENCOUNTER — Ambulatory Visit (HOSPITAL_COMMUNITY)
Admission: EM | Admit: 2023-06-02 | Discharge: 2023-06-02 | Disposition: A | Discharge status: Home / Self Care | Payer: BC Managed Care – PPO | Attending: Emergency Medicine | Admitting: Emergency Medicine

## 2023-06-02 DIAGNOSIS — S29012A Strain of muscle and tendon of back wall of thorax, initial encounter: Secondary | ICD-10-CM | POA: Diagnosis not present

## 2023-06-02 MED ORDER — KETOROLAC TROMETHAMINE 30 MG/ML IJ SOLN
INTRAMUSCULAR | Status: AC
  Filled 2023-06-02: qty 1

## 2023-06-02 MED ORDER — KETOROLAC TROMETHAMINE 30 MG/ML IJ SOLN
30.0000 mg | Freq: Once | INTRAMUSCULAR | Status: AC
  Administered 2023-06-02: 30 mg via INTRAMUSCULAR

## 2023-06-02 NOTE — ED Triage Notes (Signed)
Pt having back pain that started today. Denies any injury, lifting etc.  Denies problems with bowels or bladder.   Sunday had chest pains and was seen in ED.

## 2023-06-02 NOTE — Discharge Instructions (Addendum)
Alternate Tylenol and Ibuprofen as needed for pain. You can also alternate with heat and ice for pain. If symptoms persist you can return here as needed or follow-up with orthopedic or primary care provider as needed.

## 2023-06-02 NOTE — ED Provider Notes (Signed)
[facility]    CSN: [id number] Arrival date & time: 06/02/23  1243      History   Chief Complaint Chief Complaint  Patient presents with   Back Pain    HPI Manuel Bolton is a 27 y.o. male.   Patient presents with right upper back pain that began today.  Denies any known injury.  Patient was seen for chest pain in the ED and mother reports that he was laying awkwardly in the hospital bed and thinks the pain may have been related to this.  Denies numbness, weakness, chest pain, shoulder pain, and neck pain.   Back Pain Associated symptoms: no chest pain     History reviewed. No pertinent past medical history.  Patient Active Problem List   Diagnosis Date Noted   Hearing loss secondary to cerumen impaction, right 10/14/2022   Routine general medical examination at a health care facility 10/14/2022   Bilateral hearing loss 05/28/2021   Hearing loss of both ears due to cerumen impaction 05/27/2021   Scarring, keloid 05/27/2021   Alopecia of scalp 05/27/2021   Need for hepatitis C screening test 05/27/2021    History reviewed. No pertinent surgical history.     Home Medications    Prior to Admission medications   Not on File    Family History Family History  Problem Relation Age of Onset   Diabetes Other    Stroke Other    Cancer Other    Healthy Mother    Healthy Father     Social History Social History   Tobacco Use   Smoking status: Never   Smokeless tobacco: Never  Substance Use Topics   Alcohol use: No   Drug use: No     Allergies   Patient has no known allergies.   Review of Systems Review of Systems  Respiratory:  Negative for shortness of breath.   Cardiovascular:  Negative for chest pain.  Musculoskeletal:  Positive for back pain. Negative for gait problem, joint swelling, neck pain and neck stiffness.     Physical Exam Triage Vital Signs ED Triage Vitals  Encounter Vitals Group     BP 06/02/23 1331 124/82      Systolic BP Percentile --      Diastolic BP Percentile --      Pulse Rate 06/02/23 1331 (!) 59     Resp 06/02/23 1331 17     Temp 06/02/23 1331 98.1 F (36.7 C)     Temp Source 06/02/23 1331 Oral     SpO2 06/02/23 1331 98 %     Weight --      Height --      Head Circumference --      Peak Flow --      Pain Score 06/02/23 1330 6     Pain Loc --      Pain Education --      Exclude from Growth Chart --    No data found.  Updated Vital Signs BP 124/82 (BP Location: Right Arm)   Pulse (!) 59   Temp 98.1 F (36.7 C) (Oral)   Resp 17   SpO2 98%   Visual Acuity Right Eye Distance:   Left Eye Distance:   Bilateral Distance:    Right Eye Near:   Left Eye Near:    Bilateral Near:     Physical Exam Vitals and nursing note reviewed.  Constitutional:      General: He is awake. He is not in acute  distress.    Appearance: Normal appearance. He is well-developed and well-groomed. He is not ill-appearing, toxic-appearing or diaphoretic.  Cardiovascular:     Rate and Rhythm: Normal rate.     Heart sounds: Normal heart sounds.  Pulmonary:     Effort: Pulmonary effort is normal.     Breath sounds: Normal breath sounds.  Musculoskeletal:        General: Tenderness present. No swelling, deformity or signs of injury.     Cervical back: Normal and normal range of motion.     Thoracic back: Tenderness present. No bony tenderness. Normal range of motion.     Lumbar back: Normal.     Comments: Mild tenderness to R upper thoracic region and mild pain with flexion.   Skin:    General: Skin is warm and dry.  Neurological:     Mental Status: He is alert.  Psychiatric:        Behavior: Behavior is cooperative.      UC Treatments / Results  Labs (all labs ordered are listed, but only abnormal results are displayed) Labs Reviewed - No data to display  EKG   Radiology No results found.  Procedures Procedures (including critical care time)  Medications Ordered in  UC Medications  ketorolac (TORADOL) 30 MG/ML injection 30 mg (30 mg Intramuscular Given 06/02/23 1401)    Initial Impression / Assessment and Plan / UC Course  I have reviewed the triage vital signs and the nursing notes.  Pertinent labs & imaging results that were available during my care of the patient were reviewed by me and considered in my medical decision making (see chart for details).     Patient presented with right upper back pain that began today.  Denies any known injury.  Patient was seen for chest pain in ED and mother reports he was laying awkwardly in the hospital bed and thinks the pain may be related to this.  Denies numbness, weakness, chest pain, shoulder pain, shortness of breath, neck pain.  Upon assessment patient has mild tenderness to right upper thoracic region and some pain with flexion. ROM intact.  Given Toradol injection in clinic.  Recommended Tylenol ibuprofen as needed.  Discussed follow-up, return precautions. Final Clinical Impressions(s) / UC Diagnoses   Final diagnoses:  Upper back strain, initial encounter     Discharge Instructions      Alternate Tylenol and Ibuprofen as needed for pain. You can also alternate with heat and ice for pain. If symptoms persist you can return here as needed or follow-up with orthopedic or primary care provider as needed.     ED Prescriptions   None    PDMP not reviewed this encounter.   Wynonia Lawman A, NP 06/02/23 [phone]

## 2023-07-14 ENCOUNTER — Ambulatory Visit: Payer: BC Managed Care – PPO | Admitting: Internal Medicine

## 2023-10-13 ENCOUNTER — Ambulatory Visit: Payer: BC Managed Care – PPO | Admitting: Internal Medicine

## 2024-03-07 ENCOUNTER — Ambulatory Visit: Payer: BC Managed Care – PPO | Admitting: Internal Medicine

## 2024-03-30 ENCOUNTER — Ambulatory Visit: Admitting: Internal Medicine

## 2024-03-30 ENCOUNTER — Encounter: Payer: Self-pay | Admitting: Internal Medicine

## 2024-03-30 VITALS — BP 128/86 | HR 68 | Temp 98.7°F | Resp 16 | Ht 71.0 in | Wt 241.2 lb

## 2024-03-30 DIAGNOSIS — Z0000 Encounter for general adult medical examination without abnormal findings: Secondary | ICD-10-CM

## 2024-03-30 DIAGNOSIS — I10 Essential (primary) hypertension: Secondary | ICD-10-CM | POA: Diagnosis not present

## 2024-03-30 NOTE — Progress Notes (Unsigned)
 Subjective:  Patient ID: Manuel Bolton, male    DOB: 01/06/1996  Age: 28 y.o. MRN: [id number]  CC: Annual Exam (No Concerns ) and Hypertension   HPI [name] presents for a CPX and f/up ---  Discussed the use of AI scribe software for clinical note transcription with the patient, who gave verbal consent to proceed.  History of Present Illness Manuel Bolton is a 28 year old male who presents for a routine checkup and blood pressure evaluation.  He recently noticed his blood pressure was 140/90 mmHg, which he considered high. He is attempting to manage it by 'weighing it down'. No symptoms such as headache, blurred vision, chest pain, shortness of breath, abdominal pain, testicular pain or swelling, numbness, weakness, or tingling.  He is not currently taking any medications and denies using substances that could raise blood pressure, such as anti-inflammatories or decongestants.  His family history is notable for his mother having high blood pressure. He is unsure about his father's history and has no siblings.  He reports no issues with sleep and has not gained weight in the past year. He works at [employer], which involves a lot of walking, and he reports good exertion tolerance.    No outpatient medications prior to visit.   No facility-administered medications prior to visit.    ROS Review of Systems  Constitutional:  Negative for appetite change, chills, diaphoresis, fatigue and fever.  HENT: Negative.    Eyes: Negative.  Negative for visual disturbance.  Respiratory: Negative.  Negative for chest tightness, shortness of breath and wheezing.   Cardiovascular:  Negative for chest pain, palpitations and leg swelling.  Gastrointestinal: Negative.  Negative for abdominal pain, constipation, diarrhea, nausea and vomiting.  Genitourinary: Negative.  Negative for difficulty urinating, penile swelling, scrotal swelling and testicular pain.  Musculoskeletal: Negative.   Negative for arthralgias and myalgias.  Skin: Negative.   Neurological:  Negative for dizziness and weakness.  Hematological:  Negative for adenopathy. Does not bruise/bleed easily.  Psychiatric/Behavioral: Negative.      Objective:  BP 128/86 (BP Location: Left Arm, Patient Position: Sitting, Cuff Size: Normal)   Pulse 68   Temp 98.7 F (37.1 C) (Oral)   Resp 16   Ht 5' 11 (1.803 m)   Wt 241 lb 3.2 oz (109.4 kg)   SpO2 99%   BMI 33.64 kg/m   BP Readings from Last 3 Encounters:  03/30/24 128/86  06/02/23 124/82  05/31/23 104/74    Wt Readings from Last 3 Encounters:  03/30/24 241 lb 3.2 oz (109.4 kg)  05/31/23 248 lb 14.4 oz (112.9 kg)  10/14/22 249 lb (112.9 kg)    Physical Exam Vitals reviewed.  Constitutional:      Appearance: Normal appearance.  HENT:     Mouth/Throat:     Mouth: Mucous membranes are moist.  Eyes:     General: No scleral icterus.    Conjunctiva/sclera: Conjunctivae normal.  Cardiovascular:     Rate and Rhythm: Normal rate and regular rhythm.     Heart sounds: No murmur heard.    No friction rub. No gallop.  Pulmonary:     Effort: Pulmonary effort is normal.     Breath sounds: No stridor. No wheezing, rhonchi or rales.  Abdominal:     General: Abdomen is flat.     Palpations: There is no mass.     Tenderness: There is no abdominal tenderness. There is no guarding.     Hernia: No hernia is  present.  Musculoskeletal:        General: Normal range of motion.     Cervical back: Neck supple.     Right lower leg: No edema.     Left lower leg: No edema.  Lymphadenopathy:     Cervical: No cervical adenopathy.  Skin:    General: Skin is warm and dry.  Neurological:     General: No focal deficit present.     Mental Status: He is alert.  Psychiatric:        Mood and Affect: Mood normal.        Behavior: Behavior normal.     Lab Results  Component Value Date   WBC 4.4 05/31/2023   HGB 13.1 05/31/2023   HCT 40.9 05/31/2023   PLT 190  05/31/2023   GLUCOSE 95 05/31/2023   CHOL 199 05/12/2018   TRIG 66 05/12/2018   HDL 71 (A) 05/12/2018   LDLCALC 114 05/12/2018   NA 138 05/31/2023   K 3.9 05/31/2023   CL 103 05/31/2023   CREATININE 1.27 (H) 05/31/2023   BUN 11 05/31/2023   CO2 23 05/31/2023   TSH 2.75 05/27/2021    No results found.  Assessment & Plan:  Routine general medical examination at a health care facility- Exam completed, labs reviewed, vaccines reviewed, no cancer screenings indicated, pt ed material was given.  -     Lipid panel; Future  Primary hypertension- His BP is well controlled. -     Basic metabolic panel with GFR; Future -     CBC with Differential/Platelet; Future -     TSH; Future -     Hepatic function panel; Future     Follow-up: No follow-ups on file.  Manuel Molt, MD
